# Patient Record
Sex: Male | Born: 1975 | Hispanic: No | Marital: Single | Smoking: Former smoker
Health system: Southern US, Community
[De-identification: ages and names within clinical notes are randomized; demographics above are authoritative.]

## PROBLEM LIST (undated history)

## (undated) DIAGNOSIS — D235 Other benign neoplasm of skin of trunk: Secondary | ICD-10-CM

## (undated) DIAGNOSIS — Z8619 Personal history of other infectious and parasitic diseases: Secondary | ICD-10-CM

## (undated) DIAGNOSIS — K219 Gastro-esophageal reflux disease without esophagitis: Secondary | ICD-10-CM

## (undated) HISTORY — PX: MOLE REMOVAL: SHX2046

## (undated) HISTORY — DX: Gastro-esophageal reflux disease without esophagitis: K21.9

## (undated) HISTORY — DX: Personal history of other infectious and parasitic diseases: Z86.19

## (undated) HISTORY — PX: CYSTECTOMY: SUR359

## (undated) HISTORY — DX: Other benign neoplasm of skin of trunk: D23.5

---

## 2010-07-12 ENCOUNTER — Encounter: Payer: Self-pay | Admitting: Family Medicine

## 2010-07-12 LAB — CONVERTED CEMR LAB
ALT: 24 units/L
AST: 17 units/L
Albumin: 4.8 g/dL
BUN: 11 mg/dL
CO2: 25 meq/L
Calcium: 9.7 mg/dL
Chloride: 102 meq/L
Creatinine, Ser: 0.97 mg/dL
HCT: 45.8 %
HDL: 49 mg/dL
Potassium: 3.9 meq/L
WBC: 4.4 10*3/uL

## 2010-10-10 ENCOUNTER — Encounter: Payer: Self-pay | Admitting: Family Medicine

## 2010-10-10 ENCOUNTER — Ambulatory Visit (INDEPENDENT_AMBULATORY_CARE_PROVIDER_SITE_OTHER): Payer: Managed Care, Other (non HMO) | Admitting: Family Medicine

## 2010-10-10 ENCOUNTER — Ambulatory Visit: Admit: 2010-10-10 | Payer: Self-pay | Admitting: Family Medicine

## 2010-10-10 DIAGNOSIS — D239 Other benign neoplasm of skin, unspecified: Secondary | ICD-10-CM

## 2010-10-10 DIAGNOSIS — K219 Gastro-esophageal reflux disease without esophagitis: Secondary | ICD-10-CM

## 2010-10-10 DIAGNOSIS — R109 Unspecified abdominal pain: Secondary | ICD-10-CM

## 2010-10-10 DIAGNOSIS — E663 Overweight: Secondary | ICD-10-CM

## 2010-10-10 DIAGNOSIS — R1030 Lower abdominal pain, unspecified: Secondary | ICD-10-CM | POA: Insufficient documentation

## 2010-10-10 DIAGNOSIS — E669 Obesity, unspecified: Secondary | ICD-10-CM | POA: Insufficient documentation

## 2010-10-17 NOTE — Assessment & Plan Note (Signed)
Summary: NEW PT TO EST/CLE   Vital Signs:  Patient profile:   35 year old male Height:      71.5 inches Weight:      201.25 pounds BMI:     27.78 Temp:     98.5 degrees F oral Pulse rate:   72 / minute Pulse rhythm:   regular BP sitting:   120 / 80  (left arm) Cuff size:   large  Vitals Entered By: Selena Batten Dance CMA (AAMA) (October 10, 2010 11:32 AM) CC: New patient to establish care   History of Present Illness: CC: new pt, establish  from [facility]  recently got health insurance.  In [place] for last year.  stomach issues - lower abd.  intermittent, cramping, tightness and stabbing.  episodes flare up every few months, last 1-2 wks.  hasn't noticed any foods or stress to trigger flares.  Cramping pain located LLQ described as deep throbbing/stabbing, not relieved by stooling.  Currently no pain.  Endorses visible swelling R abd.  Told had fatty liver in past.    No bloating/indigestion.  No unexpected weight changes (see below).  No f/c/n/v/d/c, blood in stool or urine.  No bloody diarrhea.  Has always been pretty regular as far as BMs.  has 3/day.  + h/o acid reflux in past on prilosec, now controlled with diet.  Has lost 30 lbs over last 6 mo, joined weight watchers.  has increased fiber on weight watchers.  mother with crohn's.  also would like skin spots checked.  has had moles removed x2 in past, told normal.  Preventative: tetanus 5-7 years ago, in [place]. planning wedding in [place] over summer. recently had blood work fasting done for work, will bring in.  Current Medications (verified): 1)  Multivitamins  Tabs (Multiple Vitamin) .... 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  The patient denies anorexia, fever, weight loss, weight gain, vision loss, decreased hearing, hoarseness, chest pain, syncope, dyspnea on exertion, peripheral edema, prolonged cough, headaches, hemoptysis, melena, hematochezia, severe indigestion/heartburn, hematuria, incontinence, depression, and testicular masses.         told has varicocele in past.  Physical Exam  General:  Well-developed,well-nourished,in no acute distress; alert,appropriate and cooperative throughout examination Head:  Normocephalic and atraumatic without obvious abnormalities. No apparent alopecia or balding. Eyes:  No corneal or conjunctival inflammation noted. EOMI. Perrla.  Ears:  Tms clear bilaeterally Nose:  nares clear bilaterally Mouth:  MMM, no pharyngeal erythema/edema Neck:  No deformities, masses, or tenderness noted.  no LAD, no thyromegaly Lungs:  Normal respiratory effort, chest expands symmetrically. Lungs are clear to auscultation, no crackles or wheezes. Heart:  Normal rate and regular rhythm. S1 and S2 normal without gallop, murmur, click, rub or other extra sounds. Abdomen:  Bowel sounds positive,abdomen soft and without masses, organomegaly or hernias noted.  no rebound/guarding.  no pain today. Msk:  No deformity or scoliosis noted of thoracic or lumbar spine.   Pulses:  2+ rad pulses, brisk cap refill Extremities:  no pedal edema Neurologic:  CN grossly intact ,station and gait intact Skin:  Intact without suspicious lesions or rashes  multiple nevi throughout trunk, back.  none suspicious looking.  one R lower ribcage raised, advised to  monitor Psych:  full affect, pleasant and cooperative with exam   Impression & Recommendations:  Problem # 1:  NEVUS (ICD-216.9) benign looking today.  recommend monitor, return if changing.  will likely need yearly skin checks, may refer to derm.  Problem # 2:  ABDOMINAL PAIN OTHER SPECIFIED SITE (ICD-789.09) currently symptom free.  rec return w flare for bloodwork, iFOB.  abd exam benign today.  fmhx crohn's but no blood, BM changes with pain.  not consistent with IBS.  obtain records for previous w/u, imaging.  Problem # 3:  OVERWEIGHT (ICD-278.02) encouraged continued weight loss with weight watchers.  Ht: 71.5 (10/10/2010)   Wt: 201.25 (10/10/2010)   BMI: 27.78 (10/10/2010)  Problem # 4:  ACID REFLUX DISEASE (ICD-530.81) much improved with weight loss.  Complete Medication List: 1)  Multivitamins Tabs (Multiple vitamin) .... Occasional  Patient Instructions: 1)  Return when having a flare.  we will check blood work then. 2)  In meantime, push water, back down on caffeine, ensure you're getting good fiber in diet as well as fruits/vegetables. 3)  Good job with the weight loss!  keep it up. 4)  Sign release form for records from [place].   Orders Added: 1)  New Patient Level III [99203]    Prior Medications: Current Allergies (reviewed today): No known allergies

## 2010-10-19 ENCOUNTER — Encounter: Payer: Self-pay | Admitting: Family Medicine

## 2010-11-05 ENCOUNTER — Encounter: Payer: Self-pay | Admitting: Family Medicine

## 2010-11-05 DIAGNOSIS — Z8619 Personal history of other infectious and parasitic diseases: Secondary | ICD-10-CM | POA: Insufficient documentation

## 2010-11-05 DIAGNOSIS — K219 Gastro-esophageal reflux disease without esophagitis: Secondary | ICD-10-CM | POA: Insufficient documentation

## 2010-11-12 ENCOUNTER — Ambulatory Visit (INDEPENDENT_AMBULATORY_CARE_PROVIDER_SITE_OTHER): Payer: Managed Care, Other (non HMO) | Admitting: Family Medicine

## 2010-11-12 ENCOUNTER — Encounter: Payer: Self-pay | Admitting: Family Medicine

## 2010-11-12 DIAGNOSIS — R300 Dysuria: Secondary | ICD-10-CM

## 2010-11-12 LAB — CONVERTED CEMR LAB
Bilirubin Urine: NEGATIVE
Glucose, Urine, Semiquant: NEGATIVE
Ketones, urine, test strip: NEGATIVE
Nitrite: NEGATIVE
Protein, U semiquant: NEGATIVE
Specific Gravity, Urine: 1.015
Urobilinogen, UA: 0.2
WBC Urine, dipstick: NEGATIVE
pH: 6

## 2010-11-13 ENCOUNTER — Encounter: Payer: Self-pay | Admitting: Family Medicine

## 2010-11-20 NOTE — Assessment & Plan Note (Signed)
Summary: ?UTI/CLE   Vital Signs:  Patient profile:   35 year old male Weight:      204.25 pounds Temp:     98.5 degrees F oral Pulse rate:   80 / minute Pulse rhythm:   regular BP sitting:   118 / 80  (left arm) Cuff size:   large  Vitals Entered By: Selena Batten Dance CMA Duncan Dull) (November 12, 2010 3:58 PM) CC: ? UTI   History of Present Illness: CC: ?UTI  2d h/o incomplete emptying as well as pain.  Some dysuria, some suprapubic pressure.  Did feel nauseated.  increased frequency.  no urgency.  No fevers/chills, vomiting.  No urethral discharge.  No flank pain or lower back pain.  Mild suprapubic pressure.  Has increased cranberry juice and cystex OTC which helped some but still noticing pressure/pain.  lives with GF, monogamous.  Current Medications (verified): 1)  Multivitamins  Tabs (Multiple Vitamin) .... 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 Lungs are clear to auscultation, no crackles or wheezes. Heart:  Normal rate and regular rhythm. S1 and S2 normal without gallop, murmur, click, rub or other extra sounds. Abdomen:  Bowel sounds positive,abdomen soft and without masses, organomegaly or hernias noted.  no rebound/guarding.  no pain today.  no suprapubic pressure Pulses:  2+ rad  pulses, brisk cap refill   Impression & Recommendations:  Problem # 1:  DYSURIA (ICD-788.1) Assessment New UA reassuring, however sxs consistent with UTI.  treat as such, send culture.  if not improved, rec return for further eval, consider prostatitis.  His updated medication list for this problem includes:    Ciprofloxacin Hcl 500 Mg Tabs (Ciprofloxacin hcl) .Marland Kitchen... Take one twice daily for 5 days  Orders: UA Dipstick W/ Micro (manual) (56387) Specimen Handling (99000) T-Culture, Urine ([id number])  Complete Medication List: 1)  Multivitamins Tabs (Multiple vitamin) .... Occasional 2)  Ciprofloxacin Hcl 500 Mg Tabs (Ciprofloxacin hcl) .... Take one twice daily for 5 days  Patient Instructions: 1)  Urine is looking overall clear, however symptoms do sound like infection. 2)  Will send culture. 3)  Will treat as such with 5 days of antibiotic twice daily. 4)  Push fluids, plenty of rest.  tylenol for discomfort. 5)  Update [place] if not improved after antibiotic. Prescriptions: CIPROFLOXACIN HCL 500 MG TABS (CIPROFLOXACIN HCL) take one twice daily for 5 days  #10 x 0   Entered and Authorized by:   Eustaquio Boyden  MD   Signed by:   Eustaquio Boyden  MD on 11/12/2010   Method used:   Electronically to        [facility] (retail)       [address]       [place], [place]  [address]       Ph: [phone] or [phone]       Fax: [phone]  RxID:   [id number]    Orders Added: 1)  UA Dipstick W/ Micro (manual) [81000] 2)  Est. Patient Level III [06269] 3)  Specimen Handling [99000] 4)  T-Culture, Urine [[id number]]    Current Allergies (reviewed today): No known allergies   Laboratory Results   Urine Tests  Date/Time Received: November 12, 2010 3:59 PM  Date/Time Reported: November 12, 2010 3:59 PM   Routine Urinalysis   Color: lt. yellow Appearance: Hazy Glucose: negative   (Normal Range: Negative) Bilirubin: negative   (Normal Range:  Negative) Ketone: negative   (Normal Range: Negative) Spec. Gravity: 1.015   (Normal Range: 1.003-1.035) Blood: trace-lysed   (Normal Range: Negative) pH: 6.0   (Normal Range: 5.0-8.0) Protein: negative   (Normal Range: Negative) Urobilinogen: 0.2   (Normal Range: 0-1) Nitrite: negative   (Normal Range: Negative) Leukocyte Esterace: negative   (Normal Range: Negative)  Urine Microscopic WBC/HPF: none RBC/HPF: rare Bacteria/HPF: tr Mucous/HPF: no Epithelial/HPF: no Crystals/HPF: no Casts/LPF: no Yeast/HPF: no    Comments: read by ..............Eustaquio Boyden  MD  November 12, 2010 4:26 PM  UCx sent.

## 2010-11-26 ENCOUNTER — Other Ambulatory Visit: Payer: Self-pay | Admitting: Family Medicine

## 2010-11-26 ENCOUNTER — Encounter (INDEPENDENT_AMBULATORY_CARE_PROVIDER_SITE_OTHER): Payer: Managed Care, Other (non HMO) | Admitting: Family Medicine

## 2010-11-26 ENCOUNTER — Encounter: Payer: Self-pay | Admitting: Family Medicine

## 2010-11-26 DIAGNOSIS — E785 Hyperlipidemia, unspecified: Secondary | ICD-10-CM

## 2010-11-26 DIAGNOSIS — E663 Overweight: Secondary | ICD-10-CM

## 2010-11-26 DIAGNOSIS — Z0000 Encounter for general adult medical examination without abnormal findings: Secondary | ICD-10-CM

## 2010-11-26 LAB — HEPATIC FUNCTION PANEL
ALT: 46 U/L (ref 0–53)
AST: 47 U/L — ABNORMAL HIGH (ref 0–37)
Albumin: 4.3 g/dL (ref 3.5–5.2)
Alkaline Phosphatase: 47 U/L (ref 39–117)
Bilirubin, Direct: 0.1 mg/dL (ref 0.0–0.3)
Total Protein: 6.9 g/dL (ref 6.0–8.3)

## 2010-12-06 NOTE — Assessment & Plan Note (Signed)
Summary: CPX-pt advised to fast//kad   Vital Signs:  Patient profile:   35 year old male Weight:      201.75 pounds (91.70 kg) BMI:     27.85 Temp:     98.4 degrees F (36.89 degrees C) oral Pulse rate:   72 / minute Pulse rhythm:   regular BP sitting:   114 / 80  (left arm) Cuff size:   large  Vitals Entered By: Selena Batten Dance CMA (AAMA) (November 26, 2010 8:30 AM) CC: CPX   History of Present Illness: CC: CPE today  no concerns today.  last visit cholesterol was high, sent cholesterol diet.  has been doing weight watchers diet.  more fiber, less fatty foods.  Going to Gym 3x/wk, joined Y 3 wks ago.  weight stable at 201.  this am had skim milk with cheerios.  wedding scheduled June 5th.  Current Medications (verified): 1)  Multivitamins  Tabs (Multiple Vitamin) .... 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  Physical Exam  General:  Well-developed,well-nourished,in no acute distress; alert,appropriate and cooperative throughout examination Head:  Normocephalic and atraumatic without obvious abnormalities. No apparent alopecia or balding. Eyes:  No corneal or conjunctival inflammation noted. EOMI. Perrla.  Ears:  Tms clear  bilaeterally Nose:  nares clear bilaterally Mouth:  MMM, no pharyngeal erythema/edema Neck:  No deformities, masses, or tenderness noted.  no LAD, no thyromegaly Lungs:  Normal respiratory effort, chest expands symmetrically. Lungs are clear to auscultation, no crackles or wheezes. Heart:  Normal rate and regular rhythm. S1 and S2 normal without gallop, murmur, click, rub or other extra sounds. Abdomen:  Bowel sounds positive,abdomen soft and non-tender without masses, organomegaly or hernias noted. Msk:  No deformity or scoliosis noted of thoracic or lumbar spine.   Pulses:  2+ rad pulses, brisk cap refill Extremities:  no pedal edema Neurologic:  CN grossly intact ,station and gait intact Skin:  Intact without suspicious lesions or rashes  multiple nevi throughout trunk, back.  none suspicious looking.  one R lower ribcage raised, advised to monitor Psych:  full affect, pleasant and cooperative with exam   Impression & Recommendations:  Problem # 1:  HEALTH MAINTENANCE EXAM (ICD-V70.0)  Reviewed preventive care protocols, scheduled due services, and updated immunizations. age apporpriate preventative measures discussed. 100% seat belt use sunscreen use.  Problem # 2:  HYPERLIPIDEMIA, MILD (ICD-272.4) recheck dLDL.  discussed healthy diet, exercise. Orders: TLB-Cholesterol, Direct LDL (83721-DIRLDL) Venipuncture (04540)  Labs Reviewed: SGOT: 17 (07/12/2010)   SGPT: 24 (07/12/2010)   HDL:49 (07/12/2010)  LDL:162 (07/12/2010)  Chol:232 (07/12/2010)  Trig:106 (07/12/2010)  Problem # 3:  OVERWEIGHT (ICD-278.02) overweight.  discussed healthy diet, exercise.  Orders: TLB-Hepatic/Liver Function Pnl (80076-HEPATIC) TLB-Glucose, QUANT (82947-GLU)  Ht: 71.5 (10/10/2010)   Wt: 201.75 (11/26/2010)  BMI: 27.85 (11/26/2010)  Complete Medication List: 1)  Multivitamins Tabs (Multiple vitamin) .... Daily  Patient Instructions: 1)  Great to see you today. 2)  Call clinic with  questions. 3)  have a wonderful time at [facility] 4)  return in 1-2 years for next complete physical, or as needed.   Orders Added: 1)  TLB-Cholesterol, Direct LDL [83721-DIRLDL] 2)  TLB-Hepatic/Liver Function Pnl [80076-HEPATIC] 3)  TLB-Glucose, QUANT [82947-GLU] 4)  Venipuncture [36415] 5)  Est. Patient 18-39 years [99395]    Current Allergies (reviewed today): No known allergies

## 2011-02-19 ENCOUNTER — Encounter: Payer: Self-pay | Admitting: Family Medicine

## 2011-02-19 ENCOUNTER — Ambulatory Visit (INDEPENDENT_AMBULATORY_CARE_PROVIDER_SITE_OTHER): Payer: Managed Care, Other (non HMO) | Admitting: Family Medicine

## 2011-02-19 VITALS — BP 116/80 | HR 76 | Temp 98.4°F | Wt 211.0 lb

## 2011-02-19 DIAGNOSIS — Z0110 Encounter for examination of ears and hearing without abnormal findings: Secondary | ICD-10-CM

## 2011-02-19 DIAGNOSIS — H9209 Otalgia, unspecified ear: Secondary | ICD-10-CM

## 2011-02-19 DIAGNOSIS — H9201 Otalgia, right ear: Secondary | ICD-10-CM

## 2011-02-19 MED ORDER — CIPROFLOXACIN-HYDROCORTISONE 0.2-1 % OT SUSP: 3.0000 [drp] | Freq: Two times a day (BID) | OTIC | Status: DC

## 2011-02-19 MED ORDER — CIPROFLOXACIN-HYDROCORTISONE 0.2-1 % OT SUSP: 3.0000 [drp] | Freq: Two times a day (BID) | OTIC | Status: AC

## 2011-02-19 NOTE — Patient Instructions (Addendum)
You likely had some barotrauma to ear with hearing difference due to this. May use tylenol/ibuprofen as needed for discomfort. This should heal well with time.  May use afrin nasal spray as decongestant, but don't use more than 3 days at a time. Some swelling of outer ear and possible swimmer's ear - treat with cipro otic drops. If not improving over next few weeks, let us know. Call us with questions.

## 2011-02-19 NOTE — Assessment & Plan Note (Signed)
Anticipate barotrauma from recent plane rides and scuba diving. Some evidence of external otitis on exam as well (tender external ear, edema of canal.)  Treat with cipro otic.   No perforation of TM. Discussed supportive care as per instructions and anticipated course of illness.

## 2011-02-19 NOTE — Progress Notes (Signed)
  Subjective:    Patient ID: Jordan Shelton, male    DOB: Jun 23, 1976, 35 y.o.   MRN: [id number]  HPI CC: R ear pain  Wedding June 5th, [address] [place] wedding, water exposure.  Scuba diving 02/14/2011, worried went down too fast.  Felt plugged ear immediately after on R side.  Yesterday noticed starting to become more painful.  Pain with chewing, pressure sensation.  + tinnitus on right.  Some muffled hearing.  No discharge/draining from ear, no fevers/chills, congestion, RN, cough.  No h/o ear issues in past.  Review of Systems Per HPI    Objective:   Physical Exam  Nursing note and vitals reviewed. Constitutional: He appears well-developed and well-nourished. No distress.  HENT:  Head: Normocephalic and atraumatic.  Right Ear: External ear normal. No mastoid tenderness. Tympanic membrane is not perforated.  Left Ear: Hearing, tympanic membrane, external ear and ear canal normal.  Nose: Nose normal.  Mouth/Throat: Oropharynx is clear and moist. No oropharyngeal exudate.       R ear: Decreased hearing on right. External ear with edema, no discharge. TM without perf, some congestion behind TM. Rhinne normal. Weber localizes to left (good) ear.  Eyes: Conjunctivae and EOM are normal. Pupils are equal, round, and reactive to light. No scleral icterus.  Neck: Normal range of motion. Neck supple.  Lymphadenopathy:    He has no cervical adenopathy.  Skin: Skin is warm and dry. No rash noted.  Psychiatric: He has a normal mood and affect.          Assessment & Plan:

## 2011-02-20 ENCOUNTER — Telehealth: Payer: Self-pay | Admitting: *Deleted

## 2011-02-20 MED ORDER — ANTIPYRINE-BENZOCAINE 55-14 MG/ML OT SOLN: 2.0000 [drp] | Freq: Four times a day (QID) | OTIC | Status: DC | PRN

## 2011-02-20 NOTE — Telephone Encounter (Signed)
Patient was seen yesterday with an ear ache and he says that today the pain is much worse and nothing seems to be helping it. He is asking if there is possibly an ear drop that can be called to help with the pain. [facility].

## 2011-02-20 NOTE — Telephone Encounter (Signed)
I called pt.  The ear is still tender but he doesn't have mastoid pain.  No skin changes on the neck, so spreading erythema per report.  Pain continues.  I talked to him about this.  I would use aurulgan otic.  I called it in.  If not improved, he may need ENT eval.  He'll call back with update tomorrow in AM. He agrees. Routed to Dr. Reece Agar as a FYI.

## 2011-02-21 NOTE — Telephone Encounter (Signed)
Noted. Thanks.  Will await f/u call today.

## 2011-06-10 HISTORY — PX: OTHER SURGICAL HISTORY: SHX169

## 2011-06-12 ENCOUNTER — Other Ambulatory Visit (INDEPENDENT_AMBULATORY_CARE_PROVIDER_SITE_OTHER): Payer: Managed Care, Other (non HMO)

## 2011-06-12 ENCOUNTER — Other Ambulatory Visit: Payer: Self-pay | Admitting: Family Medicine

## 2011-06-12 DIAGNOSIS — Z0000 Encounter for general adult medical examination without abnormal findings: Secondary | ICD-10-CM

## 2011-06-12 DIAGNOSIS — E785 Hyperlipidemia, unspecified: Secondary | ICD-10-CM

## 2011-06-12 DIAGNOSIS — E663 Overweight: Secondary | ICD-10-CM

## 2011-06-12 LAB — COMPREHENSIVE METABOLIC PANEL
Albumin: 4.4 g/dL (ref 3.5–5.2)
CO2: 27 mEq/L (ref 19–32)
GFR: 85.28 mL/min (ref >60.00)
Glucose, Bld: 102 mg/dL — ABNORMAL HIGH (ref 70–99)
Potassium: 4 mEq/L (ref 3.5–5.1)
Sodium: 139 mEq/L (ref 135–145)
Total Protein: 7.6 g/dL (ref 6.0–8.3)

## 2011-06-12 LAB — LIPID PANEL
Cholesterol: 241 mg/dL — ABNORMAL HIGH (ref 0–200)
Total CHOL/HDL Ratio: 5

## 2011-06-14 ENCOUNTER — Encounter: Payer: Self-pay | Admitting: Family Medicine

## 2011-06-14 ENCOUNTER — Ambulatory Visit (INDEPENDENT_AMBULATORY_CARE_PROVIDER_SITE_OTHER): Payer: Managed Care, Other (non HMO) | Admitting: Family Medicine

## 2011-06-14 VITALS — BP 118/80 | HR 72 | Temp 98.8°F | Wt 210.8 lb

## 2011-06-14 DIAGNOSIS — K7689 Other specified diseases of liver: Secondary | ICD-10-CM

## 2011-06-14 DIAGNOSIS — K760 Fatty (change of) liver, not elsewhere classified: Secondary | ICD-10-CM

## 2011-06-14 DIAGNOSIS — R7309 Other abnormal glucose: Secondary | ICD-10-CM

## 2011-06-14 DIAGNOSIS — R109 Unspecified abdominal pain: Secondary | ICD-10-CM

## 2011-06-14 DIAGNOSIS — R7303 Prediabetes: Secondary | ICD-10-CM

## 2011-06-14 DIAGNOSIS — E785 Hyperlipidemia, unspecified: Secondary | ICD-10-CM

## 2011-06-14 MED ORDER — HYOSCYAMINE SULFATE 0.125 MG PO TABS: 0.1250 mg | ORAL_TABLET | ORAL | Status: AC | PRN

## 2011-06-14 NOTE — Patient Instructions (Signed)
I'm not sure where this pain is coming from. Pass by marion's office to schedule ultrasound to follow fatty liver. We will do trial of levsin for abdominal cramping.  Let me know how that goes. Good to see you today, call us with questions.

## 2011-06-14 NOTE — Progress Notes (Signed)
  Subjective:    Patient ID: Jordan Shelton, male    DOB: 05-Jul-1976, 35 y.o.   MRN: [id number]  HPI CC: abd issues  Here for CPE but actually had CPE done 11/2010.  Needs form filled out for work  Having stomach issues lately.  Going on 2 wks, nagging, intermittent cramping, tightness and stabbing.  2d ago stopped bothering him.  Episodes flare every few months, last 1-2 wks.  Initially thought due to fiber intake but no change this last time.  Sometimes uncomfortable to sleep on R side - feels tennis ball bulge on right side.  Feels deep pain.  Pain lower abdomen, no radiation up chest, to back or groin.  No fevers/chills, blood in stool, no BM, nausea/vomiting, diarrhea, constipation.  No urinary changes, dysuria, urgency, frequency, hematuria.  hasn't noticed any foods or stress to trigger flares. not relieved by stooling. Currently no pain.   No bloating/indigestion. No unexpected weight changes (see below).  Has always had regular BMs, about 3/day is norm for him, soft stools.  + h/o acid reflux in past on prilosec, that was different - burning sensation, localized to middle of chest.  now reflux controlled with diet.    No recent stress.  mother with crohn's.  Had US done 3-4 years ago for same pain - told fatty liver.  Reviewed diet and exercise. Wt Readings from Last 3 Encounters:  06/14/11 210 lb 12 oz (95.596 kg)  02/19/11 211 lb 0.6 oz (95.727 kg)  11/26/10 201 lb 12 oz (91.513 kg)   Review of Systems Per HPI    Objective:   Physical Exam  Nursing note and vitals reviewed. Constitutional: He appears well-developed and well-nourished. No distress.  HENT:  Head: Normocephalic and atraumatic.  Mouth/Throat: Oropharynx is clear and moist. No oropharyngeal exudate.  Eyes: Conjunctivae and EOM are normal. Pupils are equal, round, and reactive to light. No scleral icterus.  Neck: Normal range of motion. Neck supple.  Cardiovascular: Normal rate, regular rhythm, normal heart  sounds and intact distal pulses.   No murmur heard. Pulmonary/Chest: Effort normal and breath sounds normal. No respiratory distress. He has no wheezes. He has no rales.  Abdominal: Soft. Bowel sounds are normal. He exhibits no distension. There is no tenderness. There is no rebound and no guarding.  Musculoskeletal: He exhibits no edema.  Skin: Skin is warm and dry. No rash noted.  Psychiatric: He has a normal mood and affect.          Assessment & Plan:

## 2011-06-15 DIAGNOSIS — K760 Fatty (change of) liver, not elsewhere classified: Secondary | ICD-10-CM | POA: Insufficient documentation

## 2011-06-15 DIAGNOSIS — R7303 Prediabetes: Secondary | ICD-10-CM | POA: Insufficient documentation

## 2011-06-15 NOTE — Assessment & Plan Note (Signed)
Discussed this diagnosis with A1c 5.9% and fasting cbg 109. Discussed improtance of weight loss and watching diet.

## 2011-06-15 NOTE — Assessment & Plan Note (Signed)
Reviewed blood work - TC slightly high, LDL 170s.  Has low cholesterol diet at home.

## 2011-06-15 NOTE — Assessment & Plan Note (Signed)
has been told had this in past by [place].   Recheck [place] to follow.

## 2011-06-15 NOTE — Assessment & Plan Note (Addendum)
Not consistent with any specific abd pathology. Reviewed recent blood work - normal. ?IBS although not relieved with stooling, no BM changes with this. Not consistent with pancreatitis, crohn's.  Denies association with food or stress. Start with trial of levsin for when has flare. May recommend trial off lactose, or while in flare. If worsening, may refer to GI for further evaluation.

## 2011-06-17 ENCOUNTER — Encounter: Payer: Self-pay | Admitting: Family Medicine

## 2011-06-17 ENCOUNTER — Ambulatory Visit: Payer: Self-pay | Admitting: Family Medicine

## 2013-10-21 ENCOUNTER — Ambulatory Visit: Payer: 59 | Admitting: Family Medicine

## 2013-10-21 ENCOUNTER — Encounter: Payer: 59 | Admitting: Family Medicine

## 2013-10-21 ENCOUNTER — Ambulatory Visit (INDEPENDENT_AMBULATORY_CARE_PROVIDER_SITE_OTHER): Payer: BC Managed Care – HMO | Admitting: Family Medicine

## 2013-10-21 ENCOUNTER — Encounter: Payer: Self-pay | Admitting: Family Medicine

## 2013-10-21 VITALS — BP 124/86 | HR 88 | Temp 98.3°F | Wt 235.0 lb

## 2013-10-21 DIAGNOSIS — R590 Localized enlarged lymph nodes: Secondary | ICD-10-CM | POA: Insufficient documentation

## 2013-10-21 DIAGNOSIS — Z202 Contact with and (suspected) exposure to infections with a predominantly sexual mode of transmission: Secondary | ICD-10-CM

## 2013-10-21 DIAGNOSIS — R599 Enlarged lymph nodes, unspecified: Secondary | ICD-10-CM

## 2013-10-21 NOTE — Progress Notes (Signed)
Pre-visit discussion using our clinic review tool. No additional management support is needed unless otherwise documented below in the visit note.  

## 2013-10-21 NOTE — Progress Notes (Signed)
BP 124/86  Pulse 88  Temp(Src) 98.3 F (36.8 C) (Oral)  Wt 235 lb (106.595 kg)   CC: R jaw pain  Subjective:    Patient ID: Jordan Shelton, male    DOB: Feb 26, 1976, 38 y.o.   MRN: [id number]  HPI: Jordan Shelton is a 38 y.o. male presenting on 10/21/2013 with Jaw and neck pain  Not seen since 06/2011.  Endorses 8-9d h/o R jaw pain right under jaw bone.  Tends to recur over last few years.  Improves with tylenol or ibuprofen.  Usually with accompanying tooth pain, but not this time.  This time lingering pain, has not improved with NSAIDs or tylenol.  Describes 2/10 pain, tender to touch.  Some radiation of pain to ear. No trouble opening/closing mouth.  No fevers/chills, ear or tooth pain, sore throat.  No recent viral URI.  Denies change in salivation on right. Last saw dentist 6 mo ago, no concerns at that time.  Also - wife expecting, recenlty diagnosed with gonorrhea, but ?false positive.  Treated regardless.  Pt would like to be tested today - will return for urine CT/GC.  Endorses monogamous relationship, denies STD sxs.  Wt Readings from Last 3 Encounters:  10/21/13 235 lb (106.595 kg)  06/14/11 210 lb 12 oz (95.596 kg)  02/19/11 211 lb 0.6 oz (95.727 kg)  Body mass index is 32.32 kg/(m^2).  Relevant past medical, surgical, family and social history reviewed and updated. Allergies and medications reviewed and updated. Current Outpatient Prescriptions on File Prior to Visit  Medication Sig  . Multiple Vitamin (MULTIVITAMIN) tablet Take 1 tablet by mouth daily.     No current facility-administered medications on file prior to visit.    Review of Systems Per HPI unless specifically indicated above    Objective:    BP 124/86  Pulse 88  Temp(Src) 98.3 F (36.8 C) (Oral)  Wt 235 lb (106.595 kg)  Physical Exam  Nursing note and vitals reviewed. Constitutional: He appears well-developed and well-nourished. No distress.  HENT:  Head: Normocephalic and atraumatic.  Right Ear:  Hearing, tympanic membrane, external ear and ear canal normal.  Left Ear: Hearing, tympanic membrane, external ear and ear canal normal.  Nose: Nose normal. No mucosal edema or rhinorrhea. Right sinus exhibits no maxillary sinus tenderness and no frontal sinus tenderness. Left sinus exhibits no maxillary sinus tenderness and no frontal sinus tenderness.  Mouth/Throat: Uvula is midline, oropharynx is clear and moist and mucous membranes are normal. Abnormal dentition. No oropharyngeal exudate, posterior oropharyngeal edema, posterior oropharyngeal erythema or tonsillar abscesses.  No pain orally to palpation, no dental/gingival disease noted of entire R mouth, no oral lesions. Mildly tender to palpation at R parotid but no mass appreciated. L lower 2nd molar with dental disease  Eyes: Conjunctivae and EOM are normal. Pupils are equal, round, and reactive to light. No scleral icterus.  Neck: Normal range of motion. Neck supple. No thyromegaly present.  Lymphadenopathy:    He has cervical adenopathy (mild R subparotid LAD).  Skin: Skin is warm and dry. No rash noted.       Assessment & Plan:   Problem List Items Addressed This Visit   LAD (lymphadenopathy) of right cervical region - Primary     ?postinfectious (although pt denies h/o this).  Recommended treat with aleve bid x 5 days and OTC antihistamine.  If not improving, consider referral to ENT for eval parotid stone vs salivary issue as this is a recurring issue over last year.  Pt agrees with plan.     Other Visit Diagnoses   Exposure to STD        Relevant Orders       GC/chlamydia probe amp, urine      pt will return for STD screen with CT/GC urine probe.  Follow up plan: Return if symptoms worsen or fail to improve.

## 2013-10-21 NOTE — Addendum Note (Signed)
Addended by: Ellamae Sia on: 10/21/2013 04:33 PM   Modules accepted: Orders

## 2013-10-21 NOTE — Patient Instructions (Signed)
You do have a slightly swollen gland under right jaw - treat with aleve twice daily for 5 days with food and zyrtec or claritin daily for a week. If not improved, or if recurrent let me know and we could refer you to ENT to check saliva gland on right side. Return later this afternoon for urine test.

## 2013-10-21 NOTE — Assessment & Plan Note (Signed)
?  postinfectious (although pt denies h/o this).  Recommended treat with aleve bid x 5 days and OTC antihistamine.  If not improving, consider referral to ENT for eval parotid stone vs salivary issue as this is a recurring issue over last year.  Pt agrees with plan.

## 2013-10-22 LAB — GC/CHLAMYDIA PROBE AMP, URINE
CHLAMYDIA, SWAB/URINE, PCR: NEGATIVE
GC Probe Amp, Urine: NEGATIVE

## 2013-11-05 ENCOUNTER — Encounter: Payer: 59 | Admitting: Family Medicine

## 2013-11-11 ENCOUNTER — Other Ambulatory Visit: Payer: Self-pay | Admitting: Family Medicine

## 2013-11-11 DIAGNOSIS — E785 Hyperlipidemia, unspecified: Secondary | ICD-10-CM

## 2013-11-11 DIAGNOSIS — K760 Fatty (change of) liver, not elsewhere classified: Secondary | ICD-10-CM

## 2013-11-11 DIAGNOSIS — R7303 Prediabetes: Secondary | ICD-10-CM

## 2013-11-11 DIAGNOSIS — R590 Localized enlarged lymph nodes: Secondary | ICD-10-CM

## 2013-11-12 ENCOUNTER — Other Ambulatory Visit (INDEPENDENT_AMBULATORY_CARE_PROVIDER_SITE_OTHER): Payer: BC Managed Care – HMO

## 2013-11-12 DIAGNOSIS — R599 Enlarged lymph nodes, unspecified: Secondary | ICD-10-CM

## 2013-11-12 DIAGNOSIS — K760 Fatty (change of) liver, not elsewhere classified: Secondary | ICD-10-CM

## 2013-11-12 DIAGNOSIS — K7689 Other specified diseases of liver: Secondary | ICD-10-CM

## 2013-11-12 DIAGNOSIS — R590 Localized enlarged lymph nodes: Secondary | ICD-10-CM

## 2013-11-12 DIAGNOSIS — E785 Hyperlipidemia, unspecified: Secondary | ICD-10-CM

## 2013-11-12 DIAGNOSIS — R7303 Prediabetes: Secondary | ICD-10-CM

## 2013-11-12 DIAGNOSIS — R7309 Other abnormal glucose: Secondary | ICD-10-CM

## 2013-11-12 LAB — COMPREHENSIVE METABOLIC PANEL
ALK PHOS: 48 U/L (ref 39–117)
ALT: 35 U/L (ref 0–53)
AST: 26 U/L (ref 0–37)
Albumin: 4.1 g/dL (ref 3.5–5.2)
BILIRUBIN TOTAL: 0.5 mg/dL (ref 0.3–1.2)
BUN: 17 mg/dL (ref 6–23)
CALCIUM: 9.3 mg/dL (ref 8.4–10.5)
CHLORIDE: 105 meq/L (ref 96–112)
CO2: 27 mEq/L (ref 19–32)
CREATININE: 1.2 mg/dL (ref 0.4–1.5)
GFR: 74.29 mL/min (ref >60.00)
Glucose, Bld: 100 mg/dL — ABNORMAL HIGH (ref 70–99)
Potassium: 4.6 mEq/L (ref 3.5–5.1)
Sodium: 139 mEq/L (ref 135–145)
Total Protein: 7.2 g/dL (ref 6.0–8.3)

## 2013-11-12 LAB — LIPID PANEL
CHOL/HDL RATIO: 6
Cholesterol: 248 mg/dL — ABNORMAL HIGH (ref 0–200)
HDL: 41 mg/dL (ref >39.00)
LDL CALC: 165 mg/dL — AB (ref 0–99)
Triglycerides: 208 mg/dL — ABNORMAL HIGH (ref 0.0–149.0)
VLDL: 41.6 mg/dL — ABNORMAL HIGH (ref 0.0–40.0)

## 2013-11-12 LAB — CBC WITH DIFFERENTIAL/PLATELET
BASOS ABS: 0 10*3/uL (ref 0.0–0.1)
BASOS PCT: 0.4 % (ref 0.0–3.0)
EOS ABS: 0.4 10*3/uL (ref 0.0–0.7)
Eosinophils Relative: 6.1 % — ABNORMAL HIGH (ref 0.0–5.0)
HEMATOCRIT: 43.5 % (ref 39.0–52.0)
HEMOGLOBIN: 14.5 g/dL (ref 13.0–17.0)
Lymphocytes Relative: 32.5 % (ref 12.0–46.0)
Lymphs Abs: 1.9 10*3/uL (ref 0.7–4.0)
MCHC: 33.3 g/dL (ref 30.0–36.0)
MCV: 84.8 fl (ref 78.0–100.0)
Monocytes Absolute: 0.6 10*3/uL (ref 0.1–1.0)
Monocytes Relative: 9.9 % (ref 3.0–12.0)
NEUTROS ABS: 3 10*3/uL (ref 1.4–7.7)
Neutrophils Relative %: 51.1 % (ref 43.0–77.0)
Platelets: 211 10*3/uL (ref 150.0–400.0)
RBC: 5.13 Mil/uL (ref 4.22–5.81)
RDW: 13.9 % (ref 11.5–14.6)
WBC: 5.9 10*3/uL (ref 4.5–10.5)

## 2013-11-12 LAB — HEMOGLOBIN A1C: HEMOGLOBIN A1C: 6.1 % (ref 4.6–6.5)

## 2013-11-19 ENCOUNTER — Ambulatory Visit (INDEPENDENT_AMBULATORY_CARE_PROVIDER_SITE_OTHER): Payer: BC Managed Care – HMO | Admitting: Family Medicine

## 2013-11-19 ENCOUNTER — Encounter: Payer: Self-pay | Admitting: Family Medicine

## 2013-11-19 VITALS — BP 130/90 | HR 80 | Temp 98.7°F | Ht 71.5 in | Wt 240.0 lb

## 2013-11-19 DIAGNOSIS — Z0000 Encounter for general adult medical examination without abnormal findings: Secondary | ICD-10-CM | POA: Insufficient documentation

## 2013-11-19 DIAGNOSIS — R7309 Other abnormal glucose: Secondary | ICD-10-CM

## 2013-11-19 DIAGNOSIS — Z23 Encounter for immunization: Secondary | ICD-10-CM

## 2013-11-19 DIAGNOSIS — R7303 Prediabetes: Secondary | ICD-10-CM

## 2013-11-19 DIAGNOSIS — E785 Hyperlipidemia, unspecified: Secondary | ICD-10-CM

## 2013-11-19 DIAGNOSIS — E663 Overweight: Secondary | ICD-10-CM

## 2013-11-19 DIAGNOSIS — K219 Gastro-esophageal reflux disease without esophagitis: Secondary | ICD-10-CM

## 2013-11-19 MED ORDER — OMEPRAZOLE 40 MG PO CPDR: 40.0000 mg | DELAYED_RELEASE_CAPSULE | Freq: Every day | ORAL | Status: DC

## 2013-11-19 NOTE — Assessment & Plan Note (Signed)
Prescribe omeprazole 40mg  daily for worsening GERD - pt aware of weight relation to worsening GERD sxs.

## 2013-11-19 NOTE — Addendum Note (Signed)
Addended by: Royann Shivers A on: 11/19/2013 12:38 PM   Modules accepted: Orders

## 2013-11-19 NOTE — Assessment & Plan Note (Signed)
Body mass index is 33.01 kg/(m^2). Reviewed diet/lifestyle changes to decrease weight.

## 2013-11-19 NOTE — Assessment & Plan Note (Signed)
Reviewed sugar and A1c.  Discussed weight loss importance.

## 2013-11-19 NOTE — Progress Notes (Signed)
BP 130/90  Pulse 80  Temp(Src) 98.7 F (37.1 C) (Oral)  Ht 5' 11.5" (1.816 m)  Wt 240 lb (108.863 kg)  BMI 33.01 kg/m2   CC: CPE  Subjective:    Patient ID: Jordan Shelton, male    DOB: April 14, 1976, 38 y.o.   MRN: [id number]  HPI: Jordan Shelton is a 38 y.o. male presenting on 11/19/2013 for Annual Exam   See prior note for details re cervical LAD - this has resolved. BP Readings from Last 3 Encounters:  11/19/13 130/90  10/21/13 124/86  06/14/11 118/80   Wt Readings from Last 3 Encounters:  11/19/13 240 lb (108.863 kg)  10/21/13 235 lb (106.595 kg)  06/14/11 210 lb 12 oz (95.596 kg)  goal weight 200 - by Summer.  GERD - worsening sxs recently, esp worse at night time.  Has tried zegerid OTC , requests Rx for this.  Lives with wife and daughter.  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  Preventative: Flu - did not receive Tdap today  Relevant past medical, surgical, family and social history reviewed and updated as indicated.  Allergies and medications reviewed and updated. Current Outpatient Prescriptions on File Prior to Visit  Medication Sig  . Multiple Vitamin (MULTIVITAMIN) tablet Take 1 tablet by mouth daily.     No current facility-administered medications on file prior to visit.    Review of Systems  Constitutional: Positive for unexpected weight change (gain). Negative for fever, chills, activity change, appetite change and fatigue.  HENT: Negative for hearing loss.   Eyes: Negative for visual disturbance.  Respiratory: Negative for cough, chest tightness, shortness of breath and wheezing.   Cardiovascular: Negative for chest pain, palpitations and leg swelling.  Gastrointestinal: Negative for nausea, vomiting, abdominal pain, diarrhea, constipation, blood in stool and abdominal distention.  Genitourinary: Negative for  hematuria and difficulty urinating.  Musculoskeletal: Negative for arthralgias, myalgias and neck pain.  Skin: Negative for rash.  Neurological: Negative for dizziness, seizures, syncope and headaches.  Hematological: Negative for adenopathy. Does not bruise/bleed easily.  Psychiatric/Behavioral: Negative for dysphoric mood. The patient is not nervous/anxious.    Per HPI unless specifically indicated above    Objective:    BP 130/90  Pulse 80  Temp(Src) 98.7 F (37.1 C) (Oral)  Ht 5' 11.5" (1.816 m)  Wt 240 lb (108.863 kg)  BMI 33.01 kg/m2  Physical Exam  Nursing note and vitals reviewed. Constitutional: He is oriented to person, place, and time. He appears well-developed and well-nourished. No distress.  HENT:  Head: Normocephalic and atraumatic.  Right Ear: Hearing, tympanic membrane, external ear and ear canal normal.  Left Ear: Hearing, tympanic membrane, external ear and ear canal normal.  Nose: Nose normal.  Mouth/Throat: Uvula is midline, oropharynx is clear and moist and mucous membranes are normal. No oropharyngeal exudate, posterior oropharyngeal edema or posterior oropharyngeal erythema.  Eyes: Conjunctivae and EOM are normal. Pupils are equal, round, and reactive to light. No scleral icterus.  Neck: Normal range of motion. Neck supple. No thyromegaly present.  Cardiovascular: Normal rate, regular rhythm, normal heart sounds and intact distal pulses.   No murmur heard. Pulses:      Radial pulses are 2+ on the right side, and 2+ on the left side.  Pulmonary/Chest: Effort normal and breath sounds normal. No respiratory distress. He has no wheezes. He has no rales.  Abdominal: Soft. Bowel  sounds are normal. He exhibits no distension and no mass. There is no tenderness. There is no rebound and no guarding.  Musculoskeletal: Normal range of motion. He exhibits no edema.  Lymphadenopathy:    He has no cervical adenopathy.  Neurological: He is alert and oriented to person,  place, and time.  CN grossly intact, station and gait intact  Skin: Skin is warm and dry. No rash noted.  Psychiatric: He has a normal mood and affect. His behavior is normal. Judgment and thought content normal.   Results for orders placed in visit on 11/12/13  LIPID PANEL      Result Value Ref Range   Cholesterol 248 (*) 0 - 200 mg/dL   Triglycerides 208.0 (*) 0.0 - 149.0 mg/dL   HDL 41.00  >39.00 mg/dL   VLDL 41.6 (*) 0.0 - 40.0 mg/dL   LDL Cholesterol 165 (*) 0 - 99 mg/dL   Total CHOL/HDL Ratio 6    HEMOGLOBIN A1C      Result Value Ref Range   Hemoglobin A1C 6.1  4.6 - 6.5 %  COMPREHENSIVE METABOLIC PANEL      Result Value Ref Range   Sodium 139  135 - 145 mEq/L   Potassium 4.6  3.5 - 5.1 mEq/L   Chloride 105  96 - 112 mEq/L   CO2 27  19 - 32 mEq/L   Glucose, Bld 100 (*) 70 - 99 mg/dL   BUN 17  6 - 23 mg/dL   Creatinine, Ser 1.2  0.4 - 1.5 mg/dL   Total Bilirubin 0.5  0.3 - 1.2 mg/dL   Alkaline Phosphatase 48  39 - 117 U/L   AST 26  0 - 37 U/L   ALT 35  0 - 53 U/L   Total Protein 7.2  6.0 - 8.3 g/dL   Albumin 4.1  3.5 - 5.2 g/dL   Calcium 9.3  8.4 - 10.5 mg/dL   GFR 74.29  >60.00 mL/min  CBC WITH DIFFERENTIAL      Result Value Ref Range   WBC 5.9  4.5 - 10.5 K/uL   RBC 5.13  4.22 - 5.81 Mil/uL   Hemoglobin 14.5  13.0 - 17.0 g/dL   HCT 43.5  39.0 - 52.0 %   MCV 84.8  78.0 - 100.0 fl   MCHC 33.3  30.0 - 36.0 g/dL   RDW 13.9  11.5 - 14.6 %   Platelets 211.0  150.0 - 400.0 K/uL   Neutrophils Relative % 51.1  43.0 - 77.0 %   Lymphocytes Relative 32.5  12.0 - 46.0 %   Monocytes Relative 9.9  3.0 - 12.0 %   Eosinophils Relative 6.1 (*) 0.0 - 5.0 %   Basophils Relative 0.4  0.0 - 3.0 %   Neutro Abs 3.0  1.4 - 7.7 K/uL   Lymphs Abs 1.9  0.7 - 4.0 K/uL   Monocytes Absolute 0.6  0.1 - 1.0 K/uL   Eosinophils Absolute 0.4  0.0 - 0.7 K/uL   Basophils Absolute 0.0  0.0 - 0.1 K/uL      Assessment & Plan:   Problem List Items Addressed This Visit   GERD (gastroesophageal  reflux disease)     Prescribe omeprazole 40mg  daily for worsening GERD - pt aware of weight relation to worsening GERD sxs.    Relevant Medications      omeprazole (PRILOSEC) capsule   Health care maintenance - Primary     Preventative protocols reviewed and updated unless pt declined. Discussed  healthy diet and lifestyle.     HYPERLIPIDEMIA, MILD     Reviewed blood work with patient - discussed worsening chol levels. Discussed dietary changes to improve triglycerides..    Obesity     Body mass index is 33.01 kg/(m^2). Reviewed diet/lifestyle changes to decrease weight.    Prediabetes     Reviewed sugar and A1c.  Discussed weight loss importance.        Follow up plan: Return in about 1 year (around 11/20/2014), or as needed, for annual exam, prior fasting for blood work.

## 2013-11-19 NOTE — Progress Notes (Signed)
Pre visit review using our clinic review tool, if applicable. No additional management support is needed unless otherwise documented below in the visit note. 

## 2013-11-19 NOTE — Assessment & Plan Note (Signed)
Reviewed blood work with patient - discussed worsening chol levels. Discussed dietary changes to improve triglycerides.Marland Kitchen

## 2013-11-19 NOTE — Patient Instructions (Addendum)
Tdap today. Work on [employer] and increased activity for sustainable weight loss.  This will help sugars and will help cholesterol levels.   Prescription for omeprazole provided today - use as needed.  Nexium samples provided as well. Good to see you today, call us with questions.

## 2013-11-19 NOTE — Assessment & Plan Note (Signed)
Preventative protocols reviewed and updated unless pt declined. Discussed healthy diet and lifestyle.  

## 2014-02-28 ENCOUNTER — Ambulatory Visit: Payer: BC Managed Care – HMO | Admitting: Family Medicine

## 2014-03-09 DIAGNOSIS — D235 Other benign neoplasm of skin of trunk: Secondary | ICD-10-CM | POA: Insufficient documentation

## 2014-03-09 HISTORY — DX: Other benign neoplasm of skin of trunk: D23.5

## 2014-03-10 ENCOUNTER — Encounter: Payer: Self-pay | Admitting: Family Medicine

## 2014-03-10 ENCOUNTER — Ambulatory Visit (INDEPENDENT_AMBULATORY_CARE_PROVIDER_SITE_OTHER): Payer: BC Managed Care – HMO | Admitting: Family Medicine

## 2014-03-10 VITALS — BP 110/82 | HR 61 | Temp 98.1°F | Wt 226.0 lb

## 2014-03-10 DIAGNOSIS — D239 Other benign neoplasm of skin, unspecified: Secondary | ICD-10-CM

## 2014-03-10 NOTE — Assessment & Plan Note (Addendum)
Atypical nevus removed via shave biospy. Sent to pathology Dressed wound with abx ointment After care instructions provided to patient.

## 2014-03-10 NOTE — Addendum Note (Signed)
Addended by: Tammi Sou on: 03/10/2014 01:19 PM   Modules accepted: Orders

## 2014-03-10 NOTE — Progress Notes (Signed)
   BP 110/82  Pulse 61  Temp(Src) 98.1 F (36.7 C) (Oral)  Wt 226 lb (102.513 kg)  SpO2 97%   CC: mole removal.  Subjective:    Patient ID: Jordan Shelton, male    DOB: [birth year], 38 y.o.   MRN: [id number]  HPI: Jordan Shelton is a 38 y.o. male presenting on 03/10/2014 for Nevus   Would like to discuss mole removal today - several moles throughout body. No significant change but worried about cancer because this has been in news recently.  Sunscreen use discussed. No sunburns in past year.  Declines dermatology referral  Wt Readings from Last 3 Encounters:  03/10/14 226 lb (102.513 kg)  11/19/13 240 lb (108.863 kg)  10/21/13 235 lb (106.595 kg)  Body mass index is 31.08 kg/(m^2).  Relevant past medical, surgical, family and social history reviewed and updated as indicated.  Allergies and medications reviewed and updated. Current Outpatient Prescriptions on File Prior to Visit  Medication Sig  . Multiple Vitamin (MULTIVITAMIN) tablet Take 1 tablet by mouth daily.    Marland Kitchen omeprazole (PRILOSEC) 40 MG capsule Take 1 capsule (40 mg total) by mouth daily.   No current facility-administered medications on file prior to visit.    Review of Systems Per HPI unless specifically indicated above    Objective:    BP 110/82  Pulse 61  Temp(Src) 98.1 F (36.7 C) (Oral)  Wt 226 lb (102.513 kg)  SpO2 97%  Physical Exam  Nursing note and vitals reviewed. Constitutional: He appears well-developed and well-nourished. No distress.  Skin: Skin is warm and dry. No rash noted.  Several typical nevi throughout body, one darker nevus R upper back.   Shave biopsy Meds, vitals, and allergies reviewed.  Indication: suspicious lesion Location: R upper back Size:  Informed consent obtained and in chart.  Pt aware of risks not limited to but including infection,  bleeding, damage to near by organs. Prep: EtOH Anesthesia: 1%lidocaine with epi, good effect Shave made with dermablade Minimal oozing,  controlled with silver nitrate Tolerated well. Sent to pathology Routine postprocedure instructions d/w pt- keep area clean and bandaged, follow up if concerns/spreading erythema/pain.    Assessment & Plan:   Problem List Items Addressed This Visit   NEVUS - Primary     Atypical nevus removed via shave biospy. Sent to pathology Dressed wound with abx ointment After care instructions provided to patient.        Follow up plan: Return if symptoms worsen or fail to improve.

## 2014-03-10 NOTE — Patient Instructions (Signed)

## 2014-03-10 NOTE — Progress Notes (Signed)
Pre visit review using our clinic review tool, if applicable. No additional management support is needed unless otherwise documented below in the visit note. 

## 2014-03-16 ENCOUNTER — Encounter: Payer: Self-pay | Admitting: Family Medicine

## 2014-03-16 NOTE — Telephone Encounter (Signed)
Please see Mychart message.

## 2014-04-04 ENCOUNTER — Encounter: Payer: Self-pay | Admitting: Family Medicine

## 2014-04-04 ENCOUNTER — Ambulatory Visit (INDEPENDENT_AMBULATORY_CARE_PROVIDER_SITE_OTHER): Payer: BC Managed Care – HMO | Admitting: Family Medicine

## 2014-04-04 VITALS — BP 128/86 | HR 96 | Temp 98.4°F | Wt 225.8 lb

## 2014-04-04 DIAGNOSIS — D235 Other benign neoplasm of skin of trunk: Secondary | ICD-10-CM

## 2014-04-04 DIAGNOSIS — J069 Acute upper respiratory infection, unspecified: Secondary | ICD-10-CM

## 2014-04-04 DIAGNOSIS — B9789 Other viral agents as the cause of diseases classified elsewhere: Principal | ICD-10-CM

## 2014-04-04 MED ORDER — AZITHROMYCIN 250 MG PO TABS: ORAL_TABLET | ORAL | Status: DC

## 2014-04-04 NOTE — Assessment & Plan Note (Signed)
Anticipate viral given short duration - discussed this. Pending newborn birth - given circumstances provided with WASP for zpack with reasons to fill discussed - fever >101, worsening productive cough or worsening sinus pressure/headache/congestion. Otherwise discussed expected course of resolution. Pt agrees with plan.

## 2014-04-04 NOTE — Assessment & Plan Note (Signed)
Reviewed path with pt.

## 2014-04-04 NOTE — Patient Instructions (Signed)
Sounds like you have a viral upper respiratory infection. Antibiotics are not needed for this.  Viral infections usually take 7-10 days to resolve.  The cough can last a few weeks to go away. Should improve over next few days. If any worsening - fever >101, worsening productive cough or not improving as expected, fill zpack antibiotic (prescription provided today). Push fluids and plenty of rest. Call clinic with questions.  Good to see you today.

## 2014-04-04 NOTE — Progress Notes (Signed)
   BP 128/86  Pulse 96  Temp(Src) 98.4 F (36.9 C) (Oral)  Wt 225 lb 12 oz (102.4 kg)  SpO2 97%   CC: URI  Subjective:    Patient ID: Jordan Shelton, male    DOB: 14-Aug-1976, 38 y.o.   MRN: [id number]  HPI: Bilal Manzer is a 38 y.o. male presenting on 04/04/2014 for URI   5d h/o cough, hoarse voice, nasal congestion with mucous production. Yesterday with sinus pressure and mild headache. Feeling some better today.  PNdrainage.  No fevers/chills, abd pain, ear or tooth pain, ST.  Has self treated with mucinex DM.   No sick contacts at home. No h/o asthma, COPD. Possible h/o allergies but no recent known exposure.  Relevant past medical, surgical, family and social history reviewed and updated as indicated.  Allergies and medications reviewed and updated. Current Outpatient Prescriptions on File Prior to Visit  Medication Sig  . Multiple Vitamin (MULTIVITAMIN) tablet Take 1 tablet by mouth daily.    Marland Kitchen omeprazole (PRILOSEC) 40 MG capsule Take 1 capsule (40 mg total) by mouth daily.   No current facility-administered medications on file prior to visit.    Review of Systems Per HPI unless specifically indicated above    Objective:    BP 128/86  Pulse 96  Temp(Src) 98.4 F (36.9 C) (Oral)  Wt 225 lb 12 oz (102.4 kg)  SpO2 97%  Physical Exam  Nursing note and vitals reviewed. Constitutional: He appears well-developed and well-nourished. No distress.  HENT:  Head: Normocephalic and atraumatic.  Right Ear: Hearing, tympanic membrane, external ear and ear canal normal.  Left Ear: Hearing, tympanic membrane, external ear and ear canal normal.  Nose: Nose normal. No mucosal edema or rhinorrhea. Right sinus exhibits no maxillary sinus tenderness and no frontal sinus tenderness. Left sinus exhibits no maxillary sinus tenderness and no frontal sinus tenderness.  Mouth/Throat: Uvula is midline, oropharynx is clear and moist and mucous membranes are normal. No oropharyngeal exudate,  posterior oropharyngeal edema, posterior oropharyngeal erythema or tonsillar abscesses.  Eyes: Conjunctivae and EOM are normal. Pupils are equal, round, and reactive to light. No scleral icterus.  Neck: Normal range of motion. Neck supple.  Cardiovascular: Normal rate, regular rhythm, normal heart sounds and intact distal pulses.   No murmur heard. Pulmonary/Chest: Effort normal and breath sounds normal. No respiratory distress. He has no wheezes. He has no rales.  Lymphadenopathy:    He has no cervical adenopathy.  Skin: Skin is warm and dry. No rash noted.       Assessment & Plan:   Problem List Items Addressed This Visit   Viral URI with cough - Primary     Anticipate viral given short duration - discussed this. Pending newborn birth - given circumstances provided with WASP for zpack with reasons to fill discussed - fever >101, worsening productive cough or worsening sinus pressure/headache/congestion. Otherwise discussed expected course of resolution. Pt agrees with plan.    Relevant Medications      azithromycin (ZITHROMAX) tablet   Dysplastic nevus of trunk     Reviewed path with pt.        Follow up plan: Return if symptoms worsen or fail to improve.

## 2014-04-04 NOTE — Progress Notes (Signed)
Pre visit review using our clinic review tool, if applicable. No additional management support is needed unless otherwise documented below in the visit note. 

## 2014-08-20 ENCOUNTER — Other Ambulatory Visit: Payer: Self-pay | Admitting: Family Medicine

## 2015-02-02 ENCOUNTER — Ambulatory Visit (INDEPENDENT_AMBULATORY_CARE_PROVIDER_SITE_OTHER): Payer: Self-pay | Admitting: Primary Care

## 2015-02-02 ENCOUNTER — Encounter: Payer: Self-pay | Admitting: Primary Care

## 2015-02-02 VITALS — BP 128/82 | HR 101 | Temp 98.4°F | Ht 71.5 in | Wt 229.4 lb

## 2015-02-02 DIAGNOSIS — R05 Cough: Secondary | ICD-10-CM

## 2015-02-02 DIAGNOSIS — J069 Acute upper respiratory infection, unspecified: Secondary | ICD-10-CM | POA: Diagnosis not present

## 2015-02-02 DIAGNOSIS — J029 Acute pharyngitis, unspecified: Secondary | ICD-10-CM | POA: Diagnosis not present

## 2015-02-02 DIAGNOSIS — B9789 Other viral agents as the cause of diseases classified elsewhere: Secondary | ICD-10-CM

## 2015-02-02 DIAGNOSIS — R059 Cough, unspecified: Secondary | ICD-10-CM

## 2015-02-02 LAB — POCT INFLUENZA A/B
INFLUENZA A, POC: NEGATIVE
INFLUENZA B, POC: NEGATIVE

## 2015-02-02 LAB — POCT RAPID STREP A (OFFICE): Rapid Strep A Screen: NEGATIVE

## 2015-02-02 MED ORDER — HYDROCODONE-HOMATROPINE 5-1.5 MG/5ML PO SYRP: 5.0000 mL | ORAL_SOLUTION | Freq: Every evening | ORAL | Status: DC | PRN

## 2015-02-02 MED ORDER — AZITHROMYCIN 250 MG PO TABS: ORAL_TABLET | ORAL | Status: DC

## 2015-02-02 MED ORDER — FLUTICASONE PROPIONATE 50 MCG/ACT NA SUSP: 2.0000 | Freq: Every day | NASAL | Status: DC

## 2015-02-02 NOTE — Progress Notes (Signed)
Subjective:    Patient ID: Jordan Shelton, male    DOB: 1975/12/10, 39 y.o.   MRN: [id number]  HPI  Jordan Shelton is a 39 year old male who presents today with a chief complaint of nasal congestion and cough. He also reports of a fever (highest of 100.7), chills, and night sweats. He did not have a fever today. His symptoms began on Sunday and was feeling better until last night when he developed more chills. He's been taking Mucinex DM with some relief. His cough is productive in the morning and is worst in the morning and throughout the night. He and is family just returned from a 10 day trip to [place], and his daughter has recently been ill with the same symptoms but is now improving.   Review of Systems  Constitutional: Positive for fever and chills.  HENT: Positive for congestion, postnasal drip, sinus pressure and sore throat. Negative for ear pain.   Respiratory: Positive for cough. Negative for shortness of breath.   Cardiovascular: Negative for chest pain.  Gastrointestinal: Negative for nausea and vomiting.  Musculoskeletal: Negative for myalgias.       Past Medical History  Diagnosis Date  . GERD (gastroesophageal reflux disease)   . History of chickenpox   . Dysplastic nevus of trunk 03/2014    mod melanocytic atypia, margins free    History   Social History  . Marital Status: Single    Spouse Name: N/A  . Number of Children: N/A  . Years of Education: N/A   Occupational History  . Scientist, research (medical)     [employer]   Social History Main Topics  . Smoking status: Former Smoker    Quit date: 09/10/2003  . Smokeless tobacco: Never Used  . Alcohol Use: 0.0 oz/week    0 Standard drinks or equivalent per week     Comment: Social  . Drug Use: No  . Sexual Activity: Not on file   Other Topics Concern  . Not on file   Social History Narrative   Lives with wife and daughter.  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    Past Surgical History  Procedure Laterality Date  . Mole removal    . Cystectomy      benign  . Abd [place]  06/2011    fatty liver    Family History  Problem Relation Age of Onset  . Prostate cancer Father 18    non agressive  . Hypertension Father   . Crohn's disease Mother   . Lupus Mother   . Stroke Maternal Grandmother     No Known Allergies  Current Outpatient Prescriptions on File Prior to Visit  Medication Sig Dispense Refill  . Multiple Vitamin (MULTIVITAMIN) tablet Take 1 tablet by mouth daily.      Marland Kitchen omeprazole (PRILOSEC) 40 MG capsule TAKE 1 CAPSULE BY MOUTH DAILY. 30 capsule 3   No current facility-administered medications on file prior to visit.    BP 128/82 mmHg  Pulse 101  Temp(Src) 98.4 F (36.9 C) (Oral)  Ht 5' 11.5" (1.816 m)  Wt 229 lb 6.4 oz (104.055 kg)  BMI 31.55 kg/m2  SpO2 98%    Objective:   Physical Exam  Constitutional: He appears ill.  HENT:  Right Ear: Tympanic membrane and ear canal normal.  Left  Ear: Ear canal normal. A middle ear effusion is present.  Mouth/Throat: Posterior oropharyngeal edema and posterior oropharyngeal erythema present. No oropharyngeal exudate.  Eyes: Conjunctivae are normal. Pupils are equal, round, and reactive to light.  Neck: Neck supple.  Cardiovascular: Normal rate and regular rhythm.   Pulmonary/Chest: Effort normal and breath sounds normal.  Lymphadenopathy:    He has no cervical adenopathy.  Skin: Skin is warm.  Skin slightly diaphoretic          Assessment & Plan:

## 2015-02-02 NOTE — Progress Notes (Signed)
Pre visit review using our clinic review tool, if applicable. No additional management support is needed unless otherwise documented below in the visit note. 

## 2015-02-02 NOTE — Patient Instructions (Signed)
Your strep and influenza tests were negative. You likely have a Viral upper respiratory tract infection that should resolve on its own. Start antibiotics Sunday if no improvement or worsening symptoms. You may take Delsym during the day for cough and Hycodan at night.  Upper Respiratory Infection, Adult An upper respiratory infection (URI) is also sometimes known as the common cold. The upper respiratory tract includes the nose, sinuses, throat, trachea, and bronchi. Bronchi are the airways leading to the lungs. Most people improve within 1 week, but symptoms can last up to 2 weeks. A residual cough may last even longer.  CAUSES Many different viruses can infect the tissues lining the upper respiratory tract. The tissues become irritated and inflamed and often become very moist. Mucus production is also common. A cold is contagious. You can easily spread the virus to others by oral contact. This includes kissing, sharing a glass, coughing, or sneezing. Touching your mouth or nose and then touching a surface, which is then touched by another person, can also spread the virus. SYMPTOMS  Symptoms typically develop 1 to 3 days after you come in contact with a cold virus. Symptoms vary from person to person. They may include:  Runny nose.  Sneezing.  Nasal congestion.  Sinus irritation.  Sore throat.  Loss of voice (laryngitis).  Cough.  Fatigue.  Muscle aches.  Loss of appetite.  Headache.  Low-grade fever. DIAGNOSIS  You might diagnose your own cold based on familiar symptoms, since most people get a cold 2 to 3 times a year. Your caregiver can confirm this based on your exam. Most importantly, your caregiver can check that your symptoms are not due to another disease such as strep throat, sinusitis, pneumonia, asthma, or epiglottitis. Blood tests, throat tests, and X-rays are not necessary to diagnose a common cold, but they may sometimes be helpful in excluding other more serious  diseases. Your caregiver will decide if any further tests are required. RISKS AND COMPLICATIONS  You may be at risk for a more severe case of the common cold if you smoke cigarettes, have chronic heart disease (such as heart failure) or lung disease (such as asthma), or if you have a weakened immune system. The very young and very old are also at risk for more serious infections. Bacterial sinusitis, middle ear infections, and bacterial pneumonia can complicate the common cold. The common cold can worsen asthma and chronic obstructive pulmonary disease (COPD). Sometimes, these complications can require emergency medical care and may be life-threatening. PREVENTION  The best way to protect against getting a cold is to practice good hygiene. Avoid oral or hand contact with people with cold symptoms. Wash your hands often if contact occurs. There is no clear evidence that vitamin C, vitamin E, echinacea, or exercise reduces the chance of developing a cold. However, it is always recommended to get plenty of rest and practice good nutrition. TREATMENT  Treatment is directed at relieving symptoms. There is no cure. Antibiotics are not effective, because the infection is caused by a virus, not by bacteria. Treatment may include:  Increased fluid intake. Sports drinks offer valuable electrolytes, sugars, and fluids.  Breathing heated mist or steam (vaporizer or shower).  Eating chicken soup or other clear broths, and maintaining good nutrition.  Getting plenty of rest.  Using gargles or lozenges for comfort.  Controlling fevers with ibuprofen or acetaminophen as directed by your caregiver.  Increasing usage of your inhaler if you have asthma. Zinc gel and zinc lozenges, taken  in the first 24 hours of the common cold, can shorten the duration and lessen the severity of symptoms. Pain medicines may help with fever, muscle aches, and throat pain. A variety of non-prescription medicines are available to  treat congestion and runny nose. Your caregiver can make recommendations and may suggest nasal or lung inhalers for other symptoms.  HOME CARE INSTRUCTIONS   Only take over-the-counter or prescription medicines for pain, discomfort, or fever as directed by your caregiver.  Use a warm mist humidifier or inhale steam from a shower to increase air moisture. This may keep secretions moist and make it easier to breathe.  Drink enough water and fluids to keep your urine clear or pale yellow.  Rest as needed.  Return to work when your temperature has returned to normal or as your caregiver advises. You may need to stay home longer to avoid infecting others. You can also use a face mask and careful hand washing to prevent spread of the virus. SEEK MEDICAL CARE IF:   After the first few days, you feel you are getting worse rather than better.  You need your caregiver's advice about medicines to control symptoms.  You develop chills, worsening shortness of breath, or brown or red sputum. These may be signs of pneumonia.  You develop yellow or brown nasal discharge or pain in the face, especially when you bend forward. These may be signs of sinusitis.  You develop a fever, swollen neck glands, pain with swallowing, or white areas in the back of your throat. These may be signs of strep throat. SEEK IMMEDIATE MEDICAL CARE IF:   You have a fever.  You develop severe or persistent headache, ear pain, sinus pain, or chest pain.  You develop wheezing, a prolonged cough, cough up blood, or have a change in your usual mucus (if you have chronic lung disease).  You develop sore muscles or a stiff neck. Document Released: 02/19/2001 Document Revised: 11/18/2011 Document Reviewed: 12/01/2013 [facility] Patient Information 2015 [place], [place]. This information is not intended to replace advice given to you by your health care provider. Make sure you discuss any questions you have with your health care  provider.

## 2015-02-02 NOTE — Assessment & Plan Note (Addendum)
Suspect viral involvement due to duration and symptoms. Rapid strep negative, influenza negative. Overall improving, supportive treatment provided. Ibuprofen, Delsym for day cough, Hycodan at night. Due to newborn will print RX for Zpak to hold until Sunday if no improvement or worsening fevers.

## 2015-05-24 ENCOUNTER — Other Ambulatory Visit: Payer: Self-pay | Admitting: Family Medicine

## 2015-08-30 ENCOUNTER — Ambulatory Visit (INDEPENDENT_AMBULATORY_CARE_PROVIDER_SITE_OTHER): Payer: BLUE CROSS/BLUE SHIELD

## 2015-08-30 DIAGNOSIS — Z23 Encounter for immunization: Secondary | ICD-10-CM | POA: Diagnosis not present

## 2015-09-08 ENCOUNTER — Other Ambulatory Visit: Payer: Self-pay | Admitting: Family Medicine

## 2015-09-08 NOTE — Telephone Encounter (Signed)
CPE required for additional refills, per chart

## 2015-09-27 ENCOUNTER — Other Ambulatory Visit: Payer: Self-pay | Admitting: Family Medicine

## 2015-09-27 DIAGNOSIS — E785 Hyperlipidemia, unspecified: Secondary | ICD-10-CM

## 2015-09-27 DIAGNOSIS — R7303 Prediabetes: Secondary | ICD-10-CM

## 2015-09-27 DIAGNOSIS — K760 Fatty (change of) liver, not elsewhere classified: Secondary | ICD-10-CM

## 2015-09-28 ENCOUNTER — Other Ambulatory Visit (INDEPENDENT_AMBULATORY_CARE_PROVIDER_SITE_OTHER): Payer: BLUE CROSS/BLUE SHIELD

## 2015-09-28 DIAGNOSIS — K760 Fatty (change of) liver, not elsewhere classified: Secondary | ICD-10-CM | POA: Diagnosis not present

## 2015-09-28 DIAGNOSIS — E785 Hyperlipidemia, unspecified: Secondary | ICD-10-CM | POA: Diagnosis not present

## 2015-09-28 DIAGNOSIS — R7303 Prediabetes: Secondary | ICD-10-CM | POA: Diagnosis not present

## 2015-09-28 LAB — COMPREHENSIVE METABOLIC PANEL
ALT: 40 U/L (ref 0–53)
AST: 28 U/L (ref 0–37)
Albumin: 4.3 g/dL (ref 3.5–5.2)
Alkaline Phosphatase: 57 U/L (ref 39–117)
BILIRUBIN TOTAL: 0.4 mg/dL (ref 0.2–1.2)
BUN: 13 mg/dL (ref 6–23)
CO2: 28 meq/L (ref 19–32)
CREATININE: 1.01 mg/dL (ref 0.40–1.50)
Calcium: 9.6 mg/dL (ref 8.4–10.5)
Chloride: 103 mEq/L (ref 96–112)
GFR: 87.98 mL/min (ref >60.00)
GLUCOSE: 110 mg/dL — AB (ref 70–99)
Potassium: 4.2 mEq/L (ref 3.5–5.1)
Sodium: 138 mEq/L (ref 135–145)
Total Protein: 7.4 g/dL (ref 6.0–8.3)

## 2015-09-28 LAB — LIPID PANEL
CHOL/HDL RATIO: 6
CHOLESTEROL: 250 mg/dL — AB (ref 0–200)
HDL: 43.4 mg/dL (ref >39.00)
NONHDL: 206.29
Triglycerides: 291 mg/dL — ABNORMAL HIGH (ref 0.0–149.0)
VLDL: 58.2 mg/dL — ABNORMAL HIGH (ref 0.0–40.0)

## 2015-09-28 LAB — LDL CHOLESTEROL, DIRECT: Direct LDL: 148 mg/dL

## 2015-09-28 LAB — TSH: TSH: 1.76 u[IU]/mL (ref 0.35–4.50)

## 2015-09-28 LAB — HEMOGLOBIN A1C: Hgb A1c MFr Bld: 6 % (ref 4.6–6.5)

## 2015-10-05 ENCOUNTER — Ambulatory Visit (INDEPENDENT_AMBULATORY_CARE_PROVIDER_SITE_OTHER): Payer: BLUE CROSS/BLUE SHIELD | Admitting: Family Medicine

## 2015-10-05 ENCOUNTER — Encounter: Payer: Self-pay | Admitting: Family Medicine

## 2015-10-05 VITALS — BP 112/80 | HR 86 | Temp 98.1°F | Ht 72.0 in | Wt 232.8 lb

## 2015-10-05 DIAGNOSIS — Z0000 Encounter for general adult medical examination without abnormal findings: Secondary | ICD-10-CM

## 2015-10-05 DIAGNOSIS — K219 Gastro-esophageal reflux disease without esophagitis: Secondary | ICD-10-CM | POA: Diagnosis not present

## 2015-10-05 DIAGNOSIS — E669 Obesity, unspecified: Secondary | ICD-10-CM

## 2015-10-05 DIAGNOSIS — E785 Hyperlipidemia, unspecified: Secondary | ICD-10-CM | POA: Diagnosis not present

## 2015-10-05 DIAGNOSIS — R7303 Prediabetes: Secondary | ICD-10-CM

## 2015-10-05 MED ORDER — OMEPRAZOLE 40 MG PO CPDR: DELAYED_RELEASE_CAPSULE | ORAL | Status: DC

## 2015-10-05 NOTE — Assessment & Plan Note (Signed)
Reviewed A1c with patient, discussed importance of weight loss

## 2015-10-05 NOTE — Assessment & Plan Note (Addendum)
sxs controlled with PPI PRN. Refilled. Discussed dietary changes to control sxs as well as importance of weight loss.

## 2015-10-05 NOTE — Assessment & Plan Note (Signed)
Chronic, deteriorated. Discussed healthy diet changes to improve chol levels. Provided with low chol diet handout.

## 2015-10-05 NOTE — Assessment & Plan Note (Signed)
Discussed healthy diet and lifestyle changes to affect sustainable weight loss  

## 2015-10-05 NOTE — Patient Instructions (Addendum)
Work on cholesterol levels and sugar. Good to see you today, call us with questions. Return as needed or in 1 year for next physical. Low chol diet handout provided today.  Health Maintenance, Male A healthy lifestyle and preventative care can promote health and wellness.  Maintain regular health, dental, and eye exams.  Eat a healthy diet. Foods like vegetables, fruits, whole grains, low-fat dairy products, and lean protein foods contain the nutrients you need and are low in calories. Decrease your intake of foods high in solid fats, added sugars, and salt. Get information about a proper diet from your health care provider, if necessary.  Regular physical exercise is one of the most important things you can do for your health. Most adults should get at least 150 minutes of moderate-intensity exercise (any activity that increases your heart rate and causes you to sweat) each week. In addition, most adults need muscle-strengthening exercises on 2 or more days a week.   Maintain a healthy weight. The body mass index (BMI) is a screening tool to identify possible weight problems. It provides an estimate of body fat based on height and weight. Your health care provider can find your BMI and can help you achieve or maintain a healthy weight. For males 20 years and older:  A BMI below 18.5 is considered underweight.  A BMI of 18.5 to 24.9 is normal.  A BMI of 25 to 29.9 is considered overweight.  A BMI of 30 and above is considered obese.  Maintain normal blood lipids and cholesterol by exercising and minimizing your intake of saturated fat. Eat a balanced diet with plenty of fruits and vegetables. Blood tests for lipids and cholesterol should begin at age 11 and be repeated every 5 years. If your lipid or cholesterol levels are high, you are over age 25, or you are at high risk for heart disease, you may need your cholesterol levels checked more frequently.Ongoing high lipid and cholesterol  levels should be treated with medicines if diet and exercise are not working.  If you smoke, find out from your health care provider how to quit. If you do not use tobacco, do not start.  Lung cancer screening is recommended for adults aged 58-80 years who are at high risk for developing lung cancer because of a history of smoking. A yearly low-dose CT scan of the lungs is recommended for people who have at least a 30-pack-year history of smoking and are current smokers or have quit within the past 15 years. A pack year of smoking is smoking an average of 1 pack of cigarettes a day for 1 year (for example, a 30-pack-year history of smoking could mean smoking 1 pack a day for 30 years or 2 packs a day for 15 years). Yearly screening should continue until the smoker has stopped smoking for at least 15 years. Yearly screening should be stopped for people who develop a health problem that would prevent them from having lung cancer treatment.  If you choose to drink alcohol, do not have more than 2 drinks per day. One drink is considered to be 12 oz (360 mL) of beer, 5 oz (150 mL) of wine, or 1.5 oz (45 mL) of liquor.  Avoid the use of street drugs. Do not share needles with anyone. Ask for help if you need support or instructions about stopping the use of drugs.  High blood pressure causes heart disease and increases the risk of stroke. High blood pressure is more likely to  develop in:  People who have blood pressure in the end of the normal range (100-139/85-89 mm Hg).  People who are overweight or obese.  People who are African American.  If you are 60-74 years of age, have your blood pressure checked every 3-5 years. If you are 16 years of age or older, have your blood pressure checked every year. You should have your blood pressure measured twice--once when you are at a hospital or clinic, and once when you are not at a hospital or clinic. Record the average of the two measurements. To check your  blood pressure when you are not at a hospital or clinic, you can use:  An automated blood pressure machine at a pharmacy.  A home blood pressure monitor.  If you are 81-19 years old, ask your health care provider if you should take aspirin to prevent heart disease.  Diabetes screening involves taking a blood sample to check your fasting blood sugar level. This should be done once every 3 years after age 83 if you are at a normal weight and without risk factors for diabetes. Testing should be considered at a younger age or be carried out more frequently if you are overweight and have at least 1 risk factor for diabetes.  Colorectal cancer can be detected and often prevented. Most routine colorectal cancer screening begins at the age of 25 and continues through age 92. However, your health care provider may recommend screening at an earlier age if you have risk factors for colon cancer. On a yearly basis, your health care provider may provide home test kits to check for hidden blood in the stool. A small camera at the end of a tube may be used to directly examine the colon (sigmoidoscopy or colonoscopy) to detect the earliest forms of colorectal cancer. Talk to your health care provider about this at age 33 when routine screening begins. A direct exam of the colon should be repeated every 5-10 years through age 33, unless early forms of precancerous polyps or small growths are found.  People who are at an increased risk for hepatitis B should be screened for this virus. You are considered at high risk for hepatitis B if:  You were born in a country where hepatitis B occurs often. Talk with your health care provider about which countries are considered high risk.  Your parents were born in a high-risk country and you have not received a shot to protect against hepatitis B (hepatitis B vaccine).  You have HIV or AIDS.  You use needles to inject street drugs.  You live with, or have sex with,  someone who has hepatitis B.  You are a man who has sex with other men (MSM).  You get hemodialysis treatment.  You take certain medicines for conditions like cancer, organ transplantation, and autoimmune conditions.  Hepatitis C blood testing is recommended for all people born from [birth year] through 1963 and any individual with known risk factors for hepatitis C.  Healthy men should no longer receive prostate-specific antigen (PSA) blood tests as part of routine cancer screening. Talk to your health care provider about prostate cancer screening.  Testicular cancer screening is not recommended for adolescents or adult males who have no symptoms. Screening includes self-exam, a health care provider exam, and other screening tests. Consult with your health care provider about any symptoms you have or any concerns you have about testicular cancer.  Practice safe sex. Use condoms and avoid high-risk sexual practices to reduce  the spread of sexually transmitted infections (STIs).  You should be screened for STIs, including gonorrhea and chlamydia if:  You are sexually active and are younger than 24 years.  You are older than 24 years, and your health care provider tells you that you are at risk for this type of infection.  Your sexual activity has changed since you were last screened, and you are at an increased risk for chlamydia or gonorrhea. Ask your health care provider if you are at risk.  If you are at risk of being infected with HIV, it is recommended that you take a prescription medicine daily to prevent HIV infection. This is called pre-exposure prophylaxis (PrEP). You are considered at risk if:  You are a man who has sex with other men (MSM).  You are a heterosexual man who is sexually active with multiple partners.  You take drugs by injection.  You are sexually active with a partner who has HIV.  Talk with your health care provider about whether you are at high risk of being  infected with HIV. If you choose to begin PrEP, you should first be tested for HIV. You should then be tested every 3 months for as long as you are taking PrEP.  Use sunscreen. Apply sunscreen liberally and repeatedly throughout the day. You should seek shade when your shadow is shorter than you. Protect yourself by wearing long sleeves, pants, a wide-brimmed hat, and sunglasses year round whenever you are outdoors.  Tell your health care provider of new moles or changes in moles, especially if there is a change in shape or color. Also, tell your health care provider if a mole is larger than the size of a pencil eraser.  A one-time screening for abdominal aortic aneurysm (AAA) and surgical repair of large AAAs by ultrasound is recommended for men aged 58-75 years who are current or former smokers.  Stay current with your vaccines (immunizations).   This information is not intended to replace advice given to you by your health care provider. Make sure you discuss any questions you have with your health care provider.   Document Released: 02/22/2008 Document Revised: 09/16/2014 Document Reviewed: 01/21/2011 Elsevier Interactive Patient Education [employer].

## 2015-10-05 NOTE — Progress Notes (Signed)
Pre visit review using our clinic review tool, if applicable. No additional management support is needed unless otherwise documented below in the visit note. 

## 2015-10-05 NOTE — Progress Notes (Signed)
BP 112/80 mmHg  Pulse 86  Temp(Src) 98.1 F (36.7 C) (Oral)  Ht 6' (1.829 m)  Wt 232 lb 12 oz (105.575 kg)  BMI 31.56 kg/m2  SpO2 96%   CC: CPE  Subjective:    Patient ID: Jordan Shelton, male    DOB: 02/15/1976, 40 y.o.   MRN: [id number]  HPI: Jordan Shelton is a 40 y.o. male presenting on 10/05/2015 for Annual Exam   Recent trip to [place].  GERD - requests refill. Takes omeprazole 40mg  several times a week which controls symptoms.   Preventative: Flu - yearly Tdap 2015 Seat belt use discussed Sunscreen use discussed. No changing moles on skin.  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  Relevant past medical, surgical, family and social history reviewed and updated as indicated. Interim medical history since our last visit reviewed. Allergies and medications reviewed and updated. No current outpatient prescriptions on file prior to visit.   No current facility-administered medications on file prior to visit.    Review of Systems  Constitutional: Negative for fever, chills, activity change, appetite change, fatigue and unexpected weight change.  HENT: Positive for congestion. Negative for hearing loss.        Recent URI  Eyes: Negative for visual disturbance.  Respiratory: Positive for cough. Negative for chest tightness, shortness of breath and wheezing.   Cardiovascular: Negative for chest pain, palpitations and leg swelling.  Gastrointestinal: Positive for diarrhea. Negative for nausea, vomiting, abdominal pain, constipation, blood in stool and abdominal distention.       Recent stomach bug  Genitourinary: Negative for hematuria and difficulty urinating.  Musculoskeletal: Negative for myalgias, arthralgias and neck pain.  Skin: Negative for rash.  Neurological: Negative for dizziness, seizures, syncope and headaches.   Hematological: Negative for adenopathy. Does not bruise/bleed easily.  Psychiatric/Behavioral: Negative for dysphoric mood. The patient is not nervous/anxious.    Per HPI unless specifically indicated in ROS section     Objective:    BP 112/80 mmHg  Pulse 86  Temp(Src) 98.1 F (36.7 C) (Oral)  Ht 6' (1.829 m)  Wt 232 lb 12 oz (105.575 kg)  BMI 31.56 kg/m2  SpO2 96%  Wt Readings from Last 3 Encounters:  10/05/15 232 lb 12 oz (105.575 kg)  02/02/15 229 lb 6.4 oz (104.055 kg)  04/04/14 225 lb 12 oz (102.4 kg)    Physical Exam  Constitutional: He is oriented to person, place, and time. He appears well-developed and well-nourished. No distress.  HENT:  Head: Normocephalic and atraumatic.  Right Ear: Hearing, tympanic membrane, external ear and ear canal normal.  Left Ear: Hearing, tympanic membrane, external ear and ear canal normal.  Nose: Nose normal.  Mouth/Throat: Uvula is midline, oropharynx is clear and moist and mucous membranes are normal. No oropharyngeal exudate, posterior oropharyngeal edema or posterior oropharyngeal erythema.  Eyes: Conjunctivae and EOM are normal. Pupils are equal, round, and reactive to light. No scleral icterus.  Neck: Normal range of motion. Neck supple. No thyromegaly present.  Cardiovascular: Normal rate, regular rhythm, normal heart sounds and intact distal pulses.   No murmur heard. Pulses:      Radial pulses are 2+ on the right side, and 2+ on the left side.  Pulmonary/Chest: Effort normal and breath sounds normal. No respiratory distress. He has no wheezes. He has no rales.  Abdominal: Soft. Bowel sounds are  normal. He exhibits no distension and no mass. There is no tenderness. There is no rebound and no guarding.  Musculoskeletal: Normal range of motion. He exhibits no edema.  Lymphadenopathy:    He has no cervical adenopathy.  Neurological: He is alert and oriented to person, place, and time.  CN grossly intact, station and gait intact    Skin: Skin is warm and dry. No rash noted.  Psychiatric: He has a normal mood and affect. His behavior is normal. Judgment and thought content normal.  Nursing note and vitals reviewed.  Results for orders placed or performed in visit on 09/28/15  Lipid panel  Result Value Ref Range   Cholesterol 250 (H) 0 - 200 mg/dL   Triglycerides 291.0 (H) 0.0 - 149.0 mg/dL   HDL 43.40 >39.00 mg/dL   VLDL 58.2 (H) 0.0 - 40.0 mg/dL   Total CHOL/HDL Ratio 6    NonHDL 206.29   Comprehensive metabolic panel  Result Value Ref Range   Sodium 138 135 - 145 mEq/L   Potassium 4.2 3.5 - 5.1 mEq/L   Chloride 103 96 - 112 mEq/L   CO2 28 19 - 32 mEq/L   Glucose, Bld 110 (H) 70 - 99 mg/dL   BUN 13 6 - 23 mg/dL   Creatinine, Ser 1.01 0.40 - 1.50 mg/dL   Total Bilirubin 0.4 0.2 - 1.2 mg/dL   Alkaline Phosphatase 57 39 - 117 U/L   AST 28 0 - 37 U/L   ALT 40 0 - 53 U/L   Total Protein 7.4 6.0 - 8.3 g/dL   Albumin 4.3 3.5 - 5.2 g/dL   Calcium 9.6 8.4 - 10.5 mg/dL   GFR 87.98 >60.00 mL/min  TSH  Result Value Ref Range   TSH 1.76 0.35 - 4.50 uIU/mL  Hemoglobin A1c  Result Value Ref Range   Hgb A1c MFr Bld 6.0 4.6 - 6.5 %  LDL cholesterol, direct  Result Value Ref Range   Direct LDL 148.0 mg/dL      Assessment & Plan:   Problem List Items Addressed This Visit    Prediabetes    Reviewed A1c with patient, discussed importance of weight loss      Obesity, Class I, BMI 30-34.9    Discussed healthy diet and lifestyle changes to affect sustainable weight loss.      HLD (hyperlipidemia)    Chronic, deteriorated. Discussed healthy diet changes to improve chol levels. Provided with low chol diet handout.      Health care maintenance - Primary    Preventative protocols reviewed and updated unless pt declined. Discussed healthy diet and lifestyle.       GERD (gastroesophageal reflux disease)    sxs controlled with PPI PRN. Refilled. Discussed dietary changes to control sxs as well as importance of  weight loss.      Relevant Medications   omeprazole (PRILOSEC) 40 MG capsule       Follow up plan: Return in about 1 year (around 10/04/2016), or as needed, for annual exam, prior fasting for blood work.

## 2015-10-05 NOTE — Assessment & Plan Note (Signed)
Preventative protocols reviewed and updated unless pt declined. Discussed healthy diet and lifestyle.  

## 2015-10-09 NOTE — Addendum Note (Signed)
Addended by: Ria Bush on: 10/09/2015 08:15 AM   Modules accepted: Miquel Dunn

## 2016-05-28 ENCOUNTER — Encounter: Payer: Self-pay | Admitting: Family Medicine

## 2016-05-28 ENCOUNTER — Ambulatory Visit (INDEPENDENT_AMBULATORY_CARE_PROVIDER_SITE_OTHER): Payer: BLUE CROSS/BLUE SHIELD | Admitting: Family Medicine

## 2016-05-28 VITALS — BP 122/78 | Temp 98.2°F | Wt 241.2 lb

## 2016-05-28 DIAGNOSIS — L74519 Primary focal hyperhidrosis, unspecified: Secondary | ICD-10-CM

## 2016-05-28 DIAGNOSIS — M7051 Other bursitis of knee, right knee: Secondary | ICD-10-CM | POA: Diagnosis not present

## 2016-05-28 DIAGNOSIS — R61 Generalized hyperhidrosis: Secondary | ICD-10-CM

## 2016-05-28 MED ORDER — DOXYCYCLINE HYCLATE 100 MG PO TABS: 100.0000 mg | ORAL_TABLET | Freq: Two times a day (BID) | ORAL | 0 refills | Status: DC

## 2016-05-28 MED ORDER — AMOXICILLIN-POT CLAVULANATE 875-125 MG PO TABS: 1.0000 | ORAL_TABLET | Freq: Two times a day (BID) | ORAL | 0 refills | Status: DC

## 2016-05-28 NOTE — Progress Notes (Signed)
BP 122/78   Temp 98.2 F (36.8 C) (Oral)   Wt 241 lb 4 oz (109.4 kg)   BMI 32.72 kg/m    CC: knee pain Subjective:    Patient ID: Jordan Shelton, male    DOB: 1976/01/10, 40 y.o.   MRN: [id number]  HPI: Ermon Sagan is a 40 y.o. male presenting on 05/28/2016 for Knee Pain   Last semester of grad school.   10d h/o R knee pain/swelling and redness with warmth of skin. Points to anterior knee as main site of pain. Acutely worse Monday morning - trouble with ambulation. Today felt better after he took some left over abx for dental procedure (augmentin 800/125 - took 3 pills). Has also taken 417m ibuprofen at home - last this morning.   Denies inciting trauma or injury. No fevers/chills, nausea.  No bug bites.  No h/o skin cellulitis. No h/o gout.  Occasional dry skin patches on knees.   Relevant past medical, surgical, family and social history reviewed and updated as indicated. Interim medical history since our last visit reviewed. Allergies and medications reviewed and updated. Current Outpatient Prescriptions on File Prior to Visit  Medication Sig  . Multiple Vitamin (MULTIVITAMIN) tablet Take 1 tablet by mouth daily.  .Marland Kitchenomeprazole (PRILOSEC) 40 MG capsule Take one capsule daily   No current facility-administered medications on file prior to visit.     Review of Systems Per HPI unless specifically indicated in ROS section     Objective:    BP 122/78   Temp 98.2 F (36.8 C) (Oral)   Wt 241 lb 4 oz (109.4 kg)   BMI 32.72 kg/m   Wt Readings from Last 3 Encounters:  05/28/16 241 lb 4 oz (109.4 kg)  10/05/15 232 lb 12 oz (105.6 kg)  02/02/15 229 lb 6.4 oz (104.1 kg)    Physical Exam  Constitutional: He appears well-developed and well-nourished. No distress.  Musculoskeletal: Normal range of motion. He exhibits no edema.  L knee WNL R knee markedly tender to palpation at suprapatellar bursa with erythema/warmth spreading inferiorly. Erythema delineated.  FROM in  flexion/extension of knee.  No pain with Mcmurray test.  Skin: Skin is warm and dry. There is erythema.  Nursing note and vitals reviewed.  Results for orders placed or performed in visit on 09/28/15  Lipid panel  Result Value Ref Range   Cholesterol 250 (H) 0 - 200 mg/dL   Triglycerides 291.0 (H) 0.0 - 149.0 mg/dL   HDL 43.40 >39.00 mg/dL   VLDL 58.2 (H) 0.0 - 40.0 mg/dL   Total CHOL/HDL Ratio 6    NonHDL 206.29   Comprehensive metabolic panel  Result Value Ref Range   Sodium 138 135 - 145 mEq/L   Potassium 4.2 3.5 - 5.1 mEq/L   Chloride 103 96 - 112 mEq/L   CO2 28 19 - 32 mEq/L   Glucose, Bld 110 (H) 70 - 99 mg/dL   BUN 13 6 - 23 mg/dL   Creatinine, Ser 1.01 0.40 - 1.50 mg/dL   Total Bilirubin 0.4 0.2 - 1.2 mg/dL   Alkaline Phosphatase 57 39 - 117 U/L   AST 28 0 - 37 U/L   ALT 40 0 - 53 U/L   Total Protein 7.4 6.0 - 8.3 g/dL   Albumin 4.3 3.5 - 5.2 g/dL   Calcium 9.6 8.4 - 10.5 mg/dL   GFR 87.98 >60.00 mL/min  TSH  Result Value Ref Range   TSH 1.76 0.35 - 4.50 uIU/mL  Hemoglobin A1c  Result Value Ref Range   Hgb A1c MFr Bld 6.0 4.6 - 6.5 %  LDL cholesterol, direct  Result Value Ref Range   Direct LDL 148.0 mg/dL      Assessment & Plan:   Problem List Items Addressed This Visit    Hyperhidrosis    Check TSH Consider switch from omeprazole to pantoprazole.      Relevant Orders   TSH   Suprapatellar bursitis of right knee - Primary    New and improved since augmentin course started - concern for septic bursitis vs other bursitis. No obvious effusion or fluctuance to collect so aspiration not performed. Check labs today (CBC, ESR, urate). Pt had 3 doses of augmentin with marked improvement noted. Will finish augmentin 7d course, will add doxycycline to cover for possible MRSA. Red flags to seek urgent care discussed.      Relevant Orders   CBC with Differential/Platelet   Uric acid   Sedimentation rate    Other Visit Diagnoses   None.      Follow up  plan: Return if symptoms worsen or fail to improve.  Javier Gutierrez, MD   

## 2016-05-28 NOTE — Assessment & Plan Note (Signed)
New and improved since augmentin course started - concern for septic bursitis vs other bursitis. No obvious effusion or fluctuance to collect so aspiration not performed. Check labs today (CBC, ESR, urate). Pt had 3 doses of augmentin with marked improvement noted. Will finish augmentin 7d course, will add doxycycline to cover for possible MRSA. Red flags to seek urgent care discussed.

## 2016-05-28 NOTE — Patient Instructions (Signed)
Continue augmentin course. Add doxycycline 100mg  twice daily for 7 days. Continue ibuprofen 600mg  twice daily with meals.  Labs today. We will be in touch with results. Let us know right away if spreading redness or worsening instead of improvement noted.

## 2016-05-28 NOTE — Assessment & Plan Note (Signed)
Check TSH Consider switch from omeprazole to pantoprazole.

## 2016-05-29 LAB — CBC WITH DIFFERENTIAL/PLATELET
BASOS ABS: 0 10*3/uL (ref 0.0–0.1)
Basophils Relative: 0.2 % (ref 0.0–3.0)
EOS ABS: 0.1 10*3/uL (ref 0.0–0.7)
Eosinophils Relative: 2 % (ref 0.0–5.0)
HCT: 41.6 % (ref 39.0–52.0)
Hemoglobin: 13.9 g/dL (ref 13.0–17.0)
LYMPHS ABS: 1.4 10*3/uL (ref 0.7–4.0)
Lymphocytes Relative: 19.3 % (ref 12.0–46.0)
MCHC: 33.4 g/dL (ref 30.0–36.0)
MCV: 83.4 fl (ref 78.0–100.0)
MONO ABS: 0.7 10*3/uL (ref 0.1–1.0)
Monocytes Relative: 10 % (ref 3.0–12.0)
NEUTROS ABS: 5 10*3/uL (ref 1.4–7.7)
NEUTROS PCT: 68.5 % (ref 43.0–77.0)
PLATELETS: 233 10*3/uL (ref 150.0–400.0)
RBC: 4.99 Mil/uL (ref 4.22–5.81)
RDW: 13.9 % (ref 11.5–15.5)
WBC: 7.3 10*3/uL (ref 4.0–10.5)

## 2016-05-29 LAB — SEDIMENTATION RATE: Sed Rate: 19 mm/hr — ABNORMAL HIGH (ref 0–15)

## 2016-05-29 LAB — URIC ACID: URIC ACID, SERUM: 4.7 mg/dL (ref 4.0–7.8)

## 2016-05-29 LAB — TSH: TSH: 1.11 u[IU]/mL (ref 0.35–4.50)

## 2016-11-13 ENCOUNTER — Ambulatory Visit (INDEPENDENT_AMBULATORY_CARE_PROVIDER_SITE_OTHER): Payer: BLUE CROSS/BLUE SHIELD | Admitting: Primary Care

## 2016-11-13 ENCOUNTER — Encounter: Payer: Self-pay | Admitting: Primary Care

## 2016-11-13 VITALS — BP 124/78 | HR 100 | Temp 98.2°F | Ht 72.0 in | Wt 240.8 lb

## 2016-11-13 DIAGNOSIS — R040 Epistaxis: Secondary | ICD-10-CM

## 2016-11-13 DIAGNOSIS — R109 Unspecified abdominal pain: Secondary | ICD-10-CM | POA: Diagnosis not present

## 2016-11-13 LAB — POC URINALSYSI DIPSTICK (AUTOMATED)
Bilirubin, UA: NEGATIVE
Blood, UA: NEGATIVE
Glucose, UA: NEGATIVE
KETONES UA: NEGATIVE
LEUKOCYTES UA: NEGATIVE
Nitrite, UA: NEGATIVE
PH UA: 6
PROTEIN UA: NEGATIVE
UROBILINOGEN UA: NEGATIVE

## 2016-11-13 MED ORDER — MUPIROCIN 2 % EX OINT: 1.0000 "application " | TOPICAL_OINTMENT | Freq: Two times a day (BID) | CUTANEOUS | 0 refills | Status: DC

## 2016-11-13 NOTE — Progress Notes (Signed)
Pre visit review using our clinic review tool, if applicable. No additional management support is needed unless otherwise documented below in the visit note. 

## 2016-11-13 NOTE — Patient Instructions (Addendum)
Your nosebleeds are likely secondary to the dry air.  A humidifier will help to increase moisture in the air.  Apply the mupirocin ointment into the nose with a q-tip twice daily for the next 1 week.  Please call me if your bleeding persists.  It was a pleasure to see you today!

## 2016-11-13 NOTE — Progress Notes (Signed)
Subjective:    Patient ID: Jordan Shelton, male    DOB: 1976-05-29, 41 y.o.   MRN: [id number]  HPI  Jordan Shelton is a 41 year old male with a history of NAFL and GERD who presents today with multiple complaints.  1) Epistaxis: Dry nose throughout the winter, noticed mild bleeding when blowing nose, but able to control with pressure. Recent episode started to right nostril about 4-5 days ago with expulsion of a large blood clot and persistent bleeding. He applied pressure and cold compresses without much improvement. His bleeding lasted for about 30 minutes. The next morning he woke up and the bleeding started again and lasted for 15 minutes. Yesterday he noticed the bleeding after leaning forward to tie his shoes. He has used a nasal tampon OTC with no bleeding since yesterday. He denies use of antihistamines or nasal sprays.   2) Pelvic Pressure: Feels pressure deep inside the lower mid abdomen, around the bladder. He denies urinary frequency, foul smelling urine, hematuria, fevers, difficulty urinating. He feels a constant pressure. His last bowel movement was last night. He was constipated several days ago that lasted for 24 hours. He took a stool softener, drank prune juice, and used a suppository with relief.   Review of Systems  Constitutional: Negative for chills and fever.  HENT: Positive for nosebleeds. Negative for sore throat.   Respiratory: Negative for cough.   Gastrointestinal:       Lower mid abdominal pressure  Genitourinary: Negative for dysuria, flank pain, frequency and hematuria.  Neurological: Negative for weakness.       Past Medical History:  Diagnosis Date  . Dysplastic nevus of trunk 03/2014   mod melanocytic atypia, margins free  . GERD (gastroesophageal reflux disease)   . History of chickenpox      Social History   Social History  . Marital status: Single    Spouse name: N/A  . Number of children: N/A  . Years of education: N/A   Occupational History  .  Scientist, research (medical)     [employer]   Social History Main Topics  . Smoking status: Former Smoker    Quit date: 09/10/2003  . Smokeless tobacco: Never Used  . Alcohol use 0.0 oz/week     Comment: Social  . Drug use: No  . Sexual activity: Not on file   Other Topics Concern  . 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    Past Surgical History:  Procedure Laterality Date  . abd [place]  06/2011   fatty liver  . CYSTECTOMY     benign  . MOLE REMOVAL      Family History  Problem Relation Age of Onset  . Prostate cancer Father 33    non agressive  . Hypertension Father   . Crohn's disease Mother   . Lupus Mother   . Stroke Maternal Grandmother   . Heart disease Mother     pacemaker  . Thyroid disease Mother   . CAD Maternal Grandfather 40    MI    No Known Allergies  Current Outpatient Prescriptions on File Prior to Visit  Medication Sig Dispense Refill  . Coenzyme Q10 (CO Q 10 PO) Take 1 capsule by mouth.    Marland Kitchen MEGARED OMEGA-3  KRILL OIL 500 MG CAPS Take 500 mg by mouth daily.    . Multiple Vitamin (MULTIVITAMIN) tablet Take 1 tablet by mouth daily.    Marland Kitchen omeprazole (PRILOSEC) 40 MG capsule Take one capsule daily 90 capsule 3  . Potassium Gluconate 550 MG TABS Take 500 mg by mouth.     No current facility-administered medications on file prior to visit.     BP 124/78   Pulse 100   Temp 98.2 F (36.8 C) (Oral)   Ht 6' (1.829 m)   Wt 240 lb 12.8 oz (109.2 kg)   SpO2 97%   BMI 32.66 kg/m    Objective:   Physical Exam  Constitutional: He appears well-nourished.  HENT:  Right Ear: Tympanic membrane and ear canal normal.  Left Ear: Tympanic membrane and ear canal normal.  Nose: Mucosal edema present. Right sinus exhibits no maxillary sinus tenderness and no frontal  sinus tenderness. Left sinus exhibits no maxillary sinus tenderness and no frontal sinus tenderness.  Mouth/Throat: Oropharynx is clear and moist.  Scabbing noted to the medial inner right nares. No active bleeding.  Eyes: Conjunctivae are normal.  Neck: Neck supple.  Cardiovascular: Normal rate and regular rhythm.   Pulmonary/Chest: Effort normal and breath sounds normal. He has no wheezes. He has no rales.  Abdominal: Soft. Bowel sounds are normal. There is no tenderness. There is no CVA tenderness.  Skin: Skin is warm and dry.          Assessment & Plan:  Epistaxis:  Nosebleeds x several days, lasting 15-30 minutes. Overall improved and no nose bleeds since packing with OTC product yesterday. Exam today with scabbing as noted. Discussed to not pick the nose. Try air humidifier. Refrain from use of nasal steroids and antihistamines. Rx for mupirocin ointment sent to pharmacy. If no improvement, will send to ENT.  Abdominal Pressure:  Present to suprapubic region, deep. No other urinary symptoms. Recent constipation. Exam today unremarkable. Could be related to recent constipation. No alarm signs, does not appear acutely ill. UA: negative Return precautions provided.  Sheral Flow, NP

## 2016-11-13 NOTE — Addendum Note (Signed)
Addended by: Jacqualin Combes on: 11/13/2016 12:04 PM   Modules accepted: Orders

## 2017-03-26 ENCOUNTER — Encounter: Payer: Self-pay | Admitting: Family Medicine

## 2017-03-26 ENCOUNTER — Ambulatory Visit (INDEPENDENT_AMBULATORY_CARE_PROVIDER_SITE_OTHER): Payer: BLUE CROSS/BLUE SHIELD | Admitting: Family Medicine

## 2017-03-26 DIAGNOSIS — J029 Acute pharyngitis, unspecified: Secondary | ICD-10-CM

## 2017-03-26 DIAGNOSIS — M6281 Muscle weakness (generalized): Secondary | ICD-10-CM | POA: Insufficient documentation

## 2017-03-26 LAB — POCT RAPID STREP A (OFFICE): RAPID STREP A SCREEN: POSITIVE — AB

## 2017-03-26 MED ORDER — PENICILLIN V POTASSIUM 500 MG PO TABS: 500.0000 mg | ORAL_TABLET | Freq: Three times a day (TID) | ORAL | 0 refills | Status: DC

## 2017-03-26 NOTE — Progress Notes (Signed)
Subjective:   Patient ID: Jordan Shelton, male    DOB: 1976/07/29, 41 y.o.   MRN: [id number]  Jordan Shelton is a pleasant 41 y.o. year old male who presents to clinic today with Sore Throat (left ) and muscle weakness  on 03/26/2017  HPI:  Sore throat and muscle weakness-  Past few days, "awareness of his throat." Kids have been sick.  Has noticed muscles are sore. Feels his upper body strength has decreased.  No HA No tick bites No rashes No fevers or chills No n/v/d  Current Outpatient Prescriptions on File Prior to Visit  Medication Sig Dispense Refill  . Coenzyme Q10 (CO Q 10 PO) Take 1 capsule by mouth.    Marland Kitchen MEGARED OMEGA-3 KRILL OIL 500 MG CAPS Take 500 mg by mouth daily.    . Multiple Vitamin (MULTIVITAMIN) tablet Take 1 tablet by mouth daily.    . mupirocin ointment (BACTROBAN) 2 % Place 1 application into the nose 2 (two) times daily. 22 g 0  . omeprazole (PRILOSEC) 40 MG capsule Take one capsule daily 90 capsule 3  . Potassium Gluconate 550 MG TABS Take 500 mg by mouth.     No current facility-administered medications on file prior to visit.     No Known Allergies  Past Medical History:  Diagnosis Date  . Dysplastic nevus of trunk 03/2014   mod melanocytic atypia, margins free  . GERD (gastroesophageal reflux disease)   . History of chickenpox     Past Surgical History:  Procedure Laterality Date  . abd [place]  06/2011   fatty liver  . CYSTECTOMY     benign  . MOLE REMOVAL      Family History  Problem Relation Age of Onset  . Prostate cancer Father 19       non agressive  . Hypertension Father   . Crohn's disease Mother   . Lupus Mother   . Stroke Maternal Grandmother   . Heart disease Mother        pacemaker  . Thyroid disease Mother   . CAD Maternal Grandfather 20       MI    Social History   Social History  . Marital status: Single    Spouse name: N/A  . Number of children: N/A  . Years of education: N/A   Occupational History  . Proofreader     [employer]   Social History Main Topics  . Smoking status: Former Smoker    Quit date: 09/10/2003  . Smokeless tobacco: Never Used  . Alcohol use 0.0 oz/week     Comment: Social  . Drug use: No  . Sexual activity: Not on file   Other Topics Concern  . 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     Review of Systems  Constitutional: Negative.   HENT: Positive for sore throat. Negative for congestion, dental problem, drooling, ear discharge, ear pain, facial swelling, hearing loss, mouth sores, nosebleeds, postnasal drip, rhinorrhea, sinus pain, sinus pressure, sneezing, tinnitus, trouble swallowing and voice change.   Respiratory: Negative.   Gastrointestinal: Negative.   Musculoskeletal: Positive for myalgias.  Neurological: Negative.   Hematological: Negative.   Psychiatric/Behavioral: Negative.   All other systems reviewed and are negative.  Objective:    BP 120/90   Pulse 95   Temp 98.3 F (36.8 C)   Ht 6' (1.829 m)   Wt 234 lb (106.1 kg)   SpO2 98%   BMI 31.74 kg/m    Physical Exam  Constitutional: He is oriented to person, place, and time. He appears well-developed and well-nourished. No distress.  HENT:  Head: Normocephalic and atraumatic.  Mouth/Throat: Uvula is midline. Posterior oropharyngeal erythema present. No oropharyngeal exudate.  Eyes: Conjunctivae are normal.  Neck: Normal range of motion.  Cardiovascular: Normal rate.   Pulmonary/Chest: Effort normal.  Lymphadenopathy:    He has no cervical adenopathy.  Neurological: He is alert and oriented to person, place, and time.  Decreased grip strength bilaterally  Skin: Skin is warm and dry. He is not diaphoretic.  Psychiatric: He has a normal mood and affect. His behavior is normal.  Judgment and thought content normal.  Nursing note and vitals reviewed.         Assessment & Plan:   Sore throat - Plan: POCT rapid strep A  Muscle weakness  Sore throat - Plan: POCT rapid strep A No Follow-up on file.

## 2017-03-26 NOTE — Patient Instructions (Signed)
Strep Throat Strep throat is a bacterial infection of the throat. Your health care provider may call the infection tonsillitis or pharyngitis, depending on whether there is swelling in the tonsils or at the back of the throat. Strep throat is most common during the cold months of the year in children who are 5-39 years of age, but it can happen during any season in people of any age. This infection is spread from person to person (contagious) through coughing, sneezing, or close contact. What are the causes? Strep throat is caused by the bacteria called Streptococcus pyogenes. What increases the risk? This condition is more likely to develop in:  People who spend time in crowded places where the infection can spread easily.  People who have close contact with someone who has strep throat.  What are the signs or symptoms? Symptoms of this condition include:  Fever or chills.  Redness, swelling, or pain in the tonsils or throat.  Pain or difficulty when swallowing.  White or yellow spots on the tonsils or throat.  Swollen, tender glands in the neck or under the jaw.  Red rash all over the body (rare).  How is this diagnosed? This condition is diagnosed by performing a rapid strep test or by taking a swab of your throat (throat culture test). Results from a rapid strep test are usually ready in a few minutes, but throat culture test results are available after one or two days. How is this treated? This condition is treated with antibiotic medicine. Follow these instructions at home: Medicines  Take over-the-counter and prescription medicines only as told by your health care provider.  Take your antibiotic as told by your health care provider. Do not stop taking the antibiotic even if you start to feel better.  Have family members who also have a sore throat or fever tested for strep throat. They may need antibiotics if they have the strep infection. Eating and drinking  Do not  share food, drinking cups, or personal items that could cause the infection to spread to other people.  If swallowing is difficult, try eating soft foods until your sore throat feels better.  Drink enough fluid to keep your urine clear or pale yellow. General instructions  Gargle with a salt-water mixture 3-4 times per day or as needed. To make a salt-water mixture, completely dissolve -1 tsp of salt in 1 cup of warm water.  Make sure that all household members wash their hands well.  Get plenty of rest.  Stay home from school or work until you have been taking antibiotics for 24 hours.  Keep all follow-up visits as told by your health care provider. This is important. Contact a health care provider if:  The glands in your neck continue to get bigger.  You develop a rash, cough, or earache.  You cough up a thick liquid that is green, yellow-brown, or bloody.  You have pain or discomfort that does not get better with medicine.  Your problems seem to be getting worse rather than better.  You have a fever. Get help right away if:  You have new symptoms, such as vomiting, severe headache, stiff or painful neck, chest pain, or shortness of breath.  You have severe throat pain, drooling, or changes in your voice.  You have swelling of the neck, or the skin on the neck becomes red and tender.  You have signs of dehydration, such as fatigue, dry mouth, and decreased urination.  You become increasingly sleepy, or   you cannot wake up completely.  Your joints become red or painful. This information is not intended to replace advice given to you by your health care provider. Make sure you discuss any questions you have with your health care provider. Document Released: 08/23/2000 Document Revised: 04/24/2016 Document Reviewed: 12/19/2014 Elsevier Interactive Patient Education  2017 [employer] Inc[employer]  

## 2017-03-26 NOTE — Assessment & Plan Note (Signed)
New- rapid strep pos- treat with PCN three times daily x 10 days. See AVS for supportive care instructions.

## 2017-03-26 NOTE — Assessment & Plan Note (Signed)
Decreased grip strength on exam. ? Due to strep illness but advised pt this needs to be followed up. Neuro referral if this persists. The patient indicates understanding of these issues and agrees with the plan.

## 2017-12-03 ENCOUNTER — Encounter: Payer: Self-pay | Admitting: Family Medicine

## 2017-12-03 ENCOUNTER — Ambulatory Visit: Payer: Managed Care, Other (non HMO) | Admitting: Family Medicine

## 2017-12-03 VITALS — BP 120/80 | HR 81 | Temp 98.1°F | Wt 229.0 lb

## 2017-12-03 DIAGNOSIS — R059 Cough, unspecified: Secondary | ICD-10-CM | POA: Insufficient documentation

## 2017-12-03 DIAGNOSIS — R05 Cough: Secondary | ICD-10-CM

## 2017-12-03 MED ORDER — BENZONATATE 100 MG PO CAPS: 100.0000 mg | ORAL_CAPSULE | Freq: Three times a day (TID) | ORAL | 0 refills | Status: DC | PRN

## 2017-12-03 MED ORDER — PREDNISONE 20 MG PO TABS: ORAL_TABLET | ORAL | 0 refills | Status: DC

## 2017-12-03 NOTE — Patient Instructions (Addendum)
You likely have persistent post viral cough after recent respiratory infection. Treat with prednisone course for bronchial inflammation. Take tessalon perls as needed for cough.  Push fluids and plenty of rest. Update [place] if not improving with this, or if persistent cough despite treatment.  Good to see you today. I hope you start feeling better soon.

## 2017-12-03 NOTE — Assessment & Plan Note (Signed)
Anticipate post-infectious cough after initial respiratory infection. Supportive care reviewed. I don't see signs of bacterial infection at this time. Will Rx prednisone taper, tessalon perls. Update if not improving with treatment. Pt agrees with plan.

## 2017-12-03 NOTE — Progress Notes (Signed)
BP 120/80 (BP Location: Left Arm, Patient Position: Sitting, Cuff Size: Normal)   Pulse 81   Temp 98.1 F (36.7 C) (Oral)   Wt 229 lb (103.9 kg)   SpO2 98%   BMI 31.06 kg/m    CC: URI Subjective:    Patient ID: Jordan Shelton, male    DOB: 1976-01-18, 42 y.o.   MRN: [id number]  HPI: Jordan Shelton is a 42 y.o. male presenting on 12/03/2017 for Cough (Productive cough- especially in the morining for about 2 mos. Has improved at times but worsened lately. Wife was just dx with bronchitis, suggested pt be checked. )   2 mo h/o intermittent productive cough, worse in the mornings. Started with more congestion, URI sxs - those symptoms have largely resolved. Now with persistent productive cough and intermittent sinus pressure. He does endorse some coughing fits. Chest > head congestion, has improved after pseudophed yesterday.   No fevers/chills, ear pain, dyspnea or wheezing.   Treating with delsym cough syrup, ricola cough drops.  Denies significant GERD sxs - rare PPI use. Raw apple cider vinegar helps a lot.  No h/o asthma.  Ex smoker. Wife recently diagnosed with bronchitis.   Relevant past medical, surgical, family and social history reviewed and updated as indicated. Interim medical history since our last visit reviewed. Allergies and medications reviewed and updated. Outpatient Medications Prior to Visit  Medication Sig Dispense Refill  . Coenzyme Q10 (CO Q 10 PO) Take 1 capsule by mouth.    Marland Kitchen MEGARED OMEGA-3 KRILL OIL 500 MG CAPS Take 500 mg by mouth daily.    . Multiple Vitamin (MULTIVITAMIN) tablet Take 1 tablet by mouth daily.    Marland Kitchen omeprazole (PRILOSEC) 40 MG capsule Take one capsule daily (Patient taking differently: Take 40 mg by mouth daily. As needed) 90 capsule 3  . Potassium Gluconate 550 MG TABS Take 500 mg by mouth.    . mupirocin ointment (BACTROBAN) 2 % Place 1 application into the nose 2 (two) times daily. 22 g 0  . penicillin v potassium (VEETID) 500 MG tablet Take 1  tablet (500 mg total) by mouth 3 (three) times daily. 30 tablet 0   No facility-administered medications prior to visit.      Per HPI unless specifically indicated in ROS section below Review of Systems     Objective:    BP 120/80 (BP Location: Left Arm, Patient Position: Sitting, Cuff Size: Normal)   Pulse 81   Temp 98.1 F (36.7 C) (Oral)   Wt 229 lb (103.9 kg)   SpO2 98%   BMI 31.06 kg/m   Wt Readings from Last 3 Encounters:  12/03/17 229 lb (103.9 kg)  03/26/17 234 lb (106.1 kg)  11/13/16 240 lb 12.8 oz (109.2 kg)    Physical Exam  Constitutional: He appears well-developed and well-nourished. No distress.  HENT:  Head: Normocephalic and atraumatic.  Right Ear: Hearing, tympanic membrane, external ear and ear canal normal.  Left Ear: Hearing, tympanic membrane, external ear and ear canal normal.  Nose: Mucosal edema (and erythema) present. No rhinorrhea. Right sinus exhibits no maxillary sinus tenderness and no frontal sinus tenderness. Left sinus exhibits no maxillary sinus tenderness and no frontal sinus tenderness.  Mouth/Throat: Uvula is midline, oropharynx is clear and moist and mucous membranes are normal. No oropharyngeal exudate, posterior oropharyngeal edema, posterior oropharyngeal erythema or tonsillar abscesses.  Eyes: Pupils are equal, round, and reactive to light. Conjunctivae and EOM are normal. No scleral icterus.  Neck: Normal range  of motion. Neck supple.  Cardiovascular: Normal rate, regular rhythm, normal heart sounds and intact distal pulses.  No murmur heard. Pulmonary/Chest: Effort normal and breath sounds normal. No respiratory distress. He has no wheezes. He has no rales.  Lungs clear  Lymphadenopathy:    He has no cervical adenopathy.  Skin: Skin is warm and dry. No rash noted.  Nursing note and vitals reviewed.     Assessment & Plan:   Problem List Items Addressed This Visit    Cough - Primary    Anticipate post-infectious cough after  initial respiratory infection. Supportive care reviewed. I don't see signs of bacterial infection at this time. Will Rx prednisone taper, tessalon perls. Update if not improving with treatment. Pt agrees with plan.           Meds ordered this encounter  Medications  . predniSONE (DELTASONE) 20 MG tablet    Sig: Take two tablets daily for 3 days followed by one tablet daily for 4 days    Dispense:  10 tablet    Refill:  0  . benzonatate (TESSALON) 100 MG capsule    Sig: Take 1 capsule (100 mg total) by mouth 3 (three) times daily as needed for cough.    Dispense:  30 capsule    Refill:  0   No orders of the defined types were placed in this encounter.   Follow up plan: Return if symptoms worsen or fail to improve.  Ria Bush, MD

## 2018-07-28 ENCOUNTER — Ambulatory Visit: Payer: Managed Care, Other (non HMO) | Admitting: Family Medicine

## 2018-07-28 ENCOUNTER — Encounter: Payer: Self-pay | Admitting: Family Medicine

## 2018-07-28 VITALS — BP 120/82 | HR 90 | Temp 98.2°F | Ht 72.0 in | Wt 231.5 lb

## 2018-07-28 DIAGNOSIS — S29012A Strain of muscle and tendon of back wall of thorax, initial encounter: Secondary | ICD-10-CM | POA: Diagnosis not present

## 2018-07-28 DIAGNOSIS — Z23 Encounter for immunization: Secondary | ICD-10-CM | POA: Diagnosis not present

## 2018-07-28 LAB — POC URINALSYSI DIPSTICK (AUTOMATED)
BILIRUBIN UA: NEGATIVE
Glucose, UA: NEGATIVE
KETONES UA: NEGATIVE
Leukocytes, UA: NEGATIVE
Nitrite, UA: NEGATIVE
PH UA: 5 (ref 5.0–8.0)
Protein, UA: POSITIVE — AB
RBC UA: NEGATIVE
Urobilinogen, UA: 0.2 E.U./dL

## 2018-07-28 MED ORDER — METHOCARBAMOL 500 MG PO TABS: 500.0000 mg | ORAL_TABLET | Freq: Three times a day (TID) | ORAL | 0 refills | Status: DC | PRN

## 2018-07-28 NOTE — Patient Instructions (Addendum)
Flu shot today I think you you have left sided rhomboid strain Do exercises provided today.  May use ibuprofen and heating pad, massage to area.  May try muscle relaxant as needed.  This should get better with above and time

## 2018-07-28 NOTE — Progress Notes (Signed)
BP 120/82 (BP Location: Left Arm, Patient Position: Sitting, Cuff Size: Large)   Pulse 90   Temp 98.2 F (36.8 C) (Oral)   Ht 6' (1.829 m)   Wt 231 lb 8 oz (105 kg)   SpO2 98%   BMI 31.40 kg/m    CC: back pain Subjective:    Patient ID: Jordan Shelton, male    DOB: [birth year], 42 y.o.   MRN: [id number]  HPI: Jordan Shelton is a 42 y.o. male presenting on 07/28/2018 for Back Pain (C/o upper/mid back pain on left side. Pain is off and on. Started 3 wks. Thinks it may be a pinched nerve. )   3-4 wk h/o L mid/upper back that does improve with certain stretching exercises. Describes sharp stabbing pain and pressure. Has tried ibuprofen and tylenol for this with temporary benefit. Pain stays at 1-2/10 intensity, 4-5/10 at its worse. Denies inciting trauma/injury. Points to L mid back.   He goes to [place] regularly.   Off and on back pain for years in same area as current symptoms.   Relevant past medical, surgical, family and social history reviewed and updated as indicated. Interim medical history since our last visit reviewed. Allergies and medications reviewed and updated. Outpatient Medications Prior to Visit  Medication Sig Dispense Refill  . Coenzyme Q10 (CO Q 10 PO) Take 1 capsule by mouth.    Marland Kitchen glycopyrrolate (ROBINUL) 1 MG tablet Take 2 tablets by mouth 2 (two) times daily.    Marland Kitchen MEGARED OMEGA-3 KRILL OIL 500 MG CAPS Take 500 mg by mouth daily.    . Multiple Vitamin (MULTIVITAMIN) tablet Take 1 tablet by mouth daily.    Marland Kitchen omeprazole (PRILOSEC) 40 MG capsule Take one capsule daily (Patient taking differently: Take 40 mg by mouth daily. As needed) 90 capsule 3  . Potassium Gluconate 550 MG TABS Take 500 mg by mouth.    . benzonatate (TESSALON) 100 MG capsule Take 1 capsule (100 mg total) by mouth 3 (three) times daily as needed for cough. 30 capsule 0  . predniSONE (DELTASONE) 20 MG tablet Take two tablets daily for 3 days followed by one tablet daily for 4 days 10 tablet 0   No  facility-administered medications prior to visit.      Per HPI unless specifically indicated in ROS section below Review of Systems     Objective:    BP 120/82 (BP Location: Left Arm, Patient Position: Sitting, Cuff Size: Large)   Pulse 90   Temp 98.2 F (36.8 C) (Oral)   Ht 6' (1.829 m)   Wt 231 lb 8 oz (105 kg)   SpO2 98%   BMI 31.40 kg/m   Wt Readings from Last 3 Encounters:  07/28/18 231 lb 8 oz (105 kg)  12/03/17 229 lb (103.9 kg)  03/26/17 234 lb (106.1 kg)    Physical Exam  Constitutional: He appears well-developed and well-nourished. No distress.  Cardiovascular: Normal rate, regular rhythm and normal heart sounds.  No murmur heard. Pulmonary/Chest: Effort normal and breath sounds normal. No respiratory distress. He has no wheezes. He has no rales.  Musculoskeletal: Normal range of motion.  FROM at shoulders Reproducible tenderness to palpation L rhomboids medial and inferior to scapula  Nursing note and vitals reviewed.  Results for orders placed or performed in visit on 07/28/18  POCT Urinalysis Dipstick (Automated)  Result Value Ref Range   Color, UA yellow    Clarity, UA clear    Glucose, UA Negative Negative  Bilirubin, UA negative    Ketones, UA negative    Spec Grav, UA >=1.030 (A) 1.010 - 1.025   Blood, UA negative    pH, UA 5.0 5.0 - 8.0   Protein, UA Positive (A) Negative   Urobilinogen, UA 0.2 0.2 or 1.0 E.U./dL   Nitrite, UA negative    Leukocytes, UA Negative Negative      Assessment & Plan:   Problem List Items Addressed This Visit    Strain of rhomboid muscle - Primary    Anticipate L sided rhomboid strain - supportive care reviewed including NSAIDs, heating pad, massage, exercises from SM pt advisor provided today. Update if not improving with treatment. Pt agrees with plan.       Relevant Orders   POCT Urinalysis Dipstick (Automated) (Completed)    Other Visit Diagnoses    Need for influenza vaccination       Relevant Orders    Flu Vaccine QUAD 36+ mos IM (Completed)       Meds ordered this encounter  Medications  . methocarbamol (ROBAXIN) 500 MG tablet    Sig: Take 1 tablet (500 mg total) by mouth 3 (three) times daily as needed for muscle spasms (sedation precautions).    Dispense:  30 tablet    Refill:  0   Orders Placed This Encounter  Procedures  . Flu Vaccine QUAD 36+ mos IM  . POCT Urinalysis Dipstick (Automated)    Follow up plan: Return if symptoms worsen or fail to improve.  Ria Bush, MD

## 2018-07-28 NOTE — Assessment & Plan Note (Signed)
Anticipate L sided rhomboid strain - supportive care reviewed including NSAIDs, heating pad, massage, exercises from [facility] pt advisor provided today. Update if not improving with treatment. Pt agrees with plan.

## 2018-09-14 ENCOUNTER — Ambulatory Visit: Payer: Managed Care, Other (non HMO) | Admitting: Family Medicine

## 2018-09-14 ENCOUNTER — Encounter: Payer: Self-pay | Admitting: Family Medicine

## 2018-09-14 VITALS — BP 120/70 | HR 84 | Temp 98.4°F | Ht 72.0 in | Wt 235.0 lb

## 2018-09-14 DIAGNOSIS — R809 Proteinuria, unspecified: Secondary | ICD-10-CM | POA: Diagnosis not present

## 2018-09-14 DIAGNOSIS — J019 Acute sinusitis, unspecified: Secondary | ICD-10-CM

## 2018-09-14 MED ORDER — PREDNISONE 20 MG PO TABS: ORAL_TABLET | ORAL | 0 refills | Status: DC

## 2018-09-14 MED ORDER — GUAIFENESIN-CODEINE 100-10 MG/5ML PO SYRP: 5.0000 mL | ORAL_SOLUTION | Freq: Two times a day (BID) | ORAL | 0 refills | Status: DC | PRN

## 2018-09-14 MED ORDER — BENZONATATE 100 MG PO CAPS: 100.0000 mg | ORAL_CAPSULE | Freq: Three times a day (TID) | ORAL | 0 refills | Status: DC | PRN

## 2018-09-14 NOTE — Assessment & Plan Note (Signed)
Rpt UA next physical.

## 2018-09-14 NOTE — Assessment & Plan Note (Signed)
Anticipate viral given improving with supportive care. Pt's main concern is persistent cough. Will Rx cheratussin for night time, tessalon perls during day, prednisone taper for inflammation. Pt agrees with plan.

## 2018-09-14 NOTE — Patient Instructions (Signed)
You have a sinus infection. Take medicine as prescribed: prednisone taper, codeine cough syrup for night time, and tessalon perls during the day.  Push fluids and plenty of rest. Nasal saline irrigation or neti pot to help drain sinuses. May use mucinex D with plenty of fluid to help mobilize mucous. Please let us know if fever >101.5, trouble opening/closing mouth, difficulty swallowing, or worsening instead of improving as expected.

## 2018-09-14 NOTE — Progress Notes (Signed)
BP 120/70 (BP Location: Left Arm, Patient Position: Sitting, Cuff Size: Large)   Pulse 84   Temp 98.4 F (36.9 C) (Oral)   Ht 6' (1.829 m)   Wt 235 lb (106.6 kg)   SpO2 97%   BMI 31.87 kg/m    CC: sinusitis Subjective:    Patient ID: Jordan Shelton, male    DOB: 1976/06/09, 43 y.o.   MRN: [id number]  HPI: Niccolas Loeper is a 43 y.o. male presenting on 09/14/2018 for Sinus Problem (C/o dry cough, sinus pressure and drainage. Sxs started about 1 wk ago. All sxs have improved except cough. Tried Ricola, Delsym and Mucinex D. )   1 wk h/o sinus congestion, PNdrainage, cough. Mainly concerned about ongoing cough affecting sleep. Tooth pain over last few days. Congestion and other symptoms are improving with mucinex D.   No fevers/chills, ear pain, ST, dyspnea or wheezing. Wife recently sick as well - with bronchitis.   Ex smoker.      Relevant past medical, surgical, family and social history reviewed and updated as indicated. Interim medical history since our last visit reviewed. Allergies and medications reviewed and updated. Outpatient Medications Prior to Visit  Medication Sig Dispense Refill  . Coenzyme Q10 (CO Q 10 PO) Take 1 capsule by mouth.    Marland Kitchen glycopyrrolate (ROBINUL) 1 MG tablet Take 2 tablets by mouth 2 (two) times daily.    Marland Kitchen MEGARED OMEGA-3 KRILL OIL 500 MG CAPS Take 500 mg by mouth daily.    . Multiple Vitamin (MULTIVITAMIN) tablet Take 1 tablet by mouth daily.    Marland Kitchen omeprazole (PRILOSEC) 40 MG capsule Take one capsule daily (Patient taking differently: Take 40 mg by mouth daily. As needed) 90 capsule 3  . Potassium Gluconate 550 MG TABS Take 500 mg by mouth.    . methocarbamol (ROBAXIN) 500 MG tablet Take 1 tablet (500 mg total) by mouth 3 (three) times daily as needed for muscle spasms (sedation precautions). 30 tablet 0   No facility-administered medications prior to visit.      Per HPI unless specifically indicated in ROS section below Review of Systems Objective:    BP 120/70 (BP Location: Left Arm, Patient Position: Sitting, Cuff Size: Large)   Pulse 84   Temp 98.4 F (36.9 C) (Oral)   Ht 6' (1.829 m)   Wt 235 lb (106.6 kg)   SpO2 97%   BMI 31.87 kg/m   Wt Readings from Last 3 Encounters:  09/14/18 235 lb (106.6 kg)  07/28/18 231 lb 8 oz (105 kg)  12/03/17 229 lb (103.9 kg)    Physical Exam Vitals signs and nursing note reviewed.  Constitutional:      General: He is not in acute distress.    Appearance: He is well-developed.  HENT:     Head: Normocephalic and atraumatic.     Right Ear: Hearing, tympanic membrane, ear canal and external ear normal.     Left Ear: Hearing, tympanic membrane, ear canal and external ear normal.     Nose: Mucosal edema (R>L nasal and sinus congestion with discharge present) present. No rhinorrhea.     Right Sinus: No maxillary sinus tenderness or frontal sinus tenderness.     Left Sinus: No maxillary sinus tenderness or frontal sinus tenderness.     Mouth/Throat:     Pharynx: Uvula midline. No oropharyngeal exudate or posterior oropharyngeal erythema.     Tonsils: No tonsillar abscesses.  Eyes:     General: No scleral icterus.  Conjunctiva/sclera: Conjunctivae normal.     Pupils: Pupils are equal, round, and reactive to light.  Neck:     Musculoskeletal: Normal range of motion and neck supple.  Cardiovascular:     Rate and Rhythm: Normal rate and regular rhythm.     Heart sounds: Normal heart sounds. No murmur.  Pulmonary:     Effort: Pulmonary effort is normal. No respiratory distress.     Breath sounds: Normal breath sounds. No wheezing or rales.  Lymphadenopathy:     Cervical: No cervical adenopathy.  Skin:    General: Skin is warm and dry.     Findings: No rash.       Results for orders placed or performed in visit on 07/28/18  POCT Urinalysis Dipstick (Automated)  Result Value Ref Range   Color, UA yellow    Clarity, UA clear    Glucose, UA Negative Negative   Bilirubin, UA negative     Ketones, UA negative    Spec Grav, UA >=1.030 (A) 1.010 - 1.025   Blood, UA negative    pH, UA 5.0 5.0 - 8.0   Protein, UA Positive (A) Negative   Urobilinogen, UA 0.2 0.2 or 1.0 E.U./dL   Nitrite, UA negative    Leukocytes, UA Negative Negative   Assessment & Plan:   Problem List Items Addressed This Visit    Proteinuria    Rpt UA next physical.       Acute non-recurrent sinusitis - Primary    Anticipate viral given improving with supportive care. Pt's main concern is persistent cough. Will Rx cheratussin for night time, tessalon perls during day, prednisone taper for inflammation. Pt agrees with plan.       Relevant Medications   predniSONE (DELTASONE) 20 MG tablet   guaiFENesin-codeine (CHERATUSSIN AC) 100-10 MG/5ML syrup   benzonatate (TESSALON) 100 MG capsule       Meds ordered this encounter  Medications  . predniSONE (DELTASONE) 20 MG tablet    Sig: Take two tablets daily for 3 days followed by one tablet daily for 4 days    Dispense:  10 tablet    Refill:  0  . guaiFENesin-codeine (CHERATUSSIN AC) 100-10 MG/5ML syrup    Sig: Take 5 mLs by mouth 2 (two) times daily as needed.    Dispense:  120 mL    Refill:  0  . benzonatate (TESSALON) 100 MG capsule    Sig: Take 1 capsule (100 mg total) by mouth 3 (three) times daily as needed for cough.    Dispense:  30 capsule    Refill:  0   No orders of the defined types were placed in this encounter.   Follow up plan: Return if symptoms worsen or fail to improve.  Ria Bush, MD

## 2018-10-01 ENCOUNTER — Ambulatory Visit (INDEPENDENT_AMBULATORY_CARE_PROVIDER_SITE_OTHER): Payer: Managed Care, Other (non HMO) | Admitting: Internal Medicine

## 2018-10-01 ENCOUNTER — Encounter: Payer: Self-pay | Admitting: Internal Medicine

## 2018-10-01 VITALS — BP 124/82 | HR 90 | Temp 99.1°F | Wt 232.0 lb

## 2018-10-01 DIAGNOSIS — J069 Acute upper respiratory infection, unspecified: Secondary | ICD-10-CM | POA: Diagnosis not present

## 2018-10-01 DIAGNOSIS — B9789 Other viral agents as the cause of diseases classified elsewhere: Secondary | ICD-10-CM

## 2018-10-01 MED ORDER — BENZONATATE 100 MG PO CAPS: 100.0000 mg | ORAL_CAPSULE | Freq: Three times a day (TID) | ORAL | 0 refills | Status: DC | PRN

## 2018-10-01 MED ORDER — GUAIFENESIN-CODEINE 100-10 MG/5ML PO SYRP: 5.0000 mL | ORAL_SOLUTION | Freq: Two times a day (BID) | ORAL | 0 refills | Status: DC | PRN

## 2018-10-01 MED ORDER — AZITHROMYCIN 250 MG PO TABS: ORAL_TABLET | ORAL | 0 refills | Status: DC

## 2018-10-01 NOTE — Progress Notes (Signed)
Subjective:    Patient ID: Jordan Shelton, male    DOB: [birth year], 43 y.o.   MRN: [id number]  HPI  Pt presents to the clinic today with c/o right ear pressure, cough and chest congestion. This started 3 weeks ago. He denies ear pain or decreased hearing. The cough is now productive of green mucous. He denies headache, runny nose, nasal congestion or shortness of breath. He denies fever, chills or body aches. He was seen 1/6 for the same, diagnosed with viral sinusitis. He was treated with Prednisone, Cheritussin AC and Tessalon. He reports some improvement but the cough persists, and now his mucous is dark. He has not tried anything OTC for this. He is concerned because he is leaving for [place] tomorrow and doesn't want to be sick when he is out of town.  Review of Systems      Past Medical History:  Diagnosis Date  . Dysplastic nevus of trunk 03/2014   mod melanocytic atypia, margins free  . GERD (gastroesophageal reflux disease)   . History of chickenpox     Current Outpatient Medications  Medication Sig Dispense Refill  . benzonatate (TESSALON) 100 MG capsule Take 1 capsule (100 mg total) by mouth 3 (three) times daily as needed for cough. 30 capsule 0  . Coenzyme Q10 (CO Q 10 PO) Take 1 capsule by mouth.    Marland Kitchen glycopyrrolate (ROBINUL) 1 MG tablet Take 2 tablets by mouth 2 (two) times daily.    Marland Kitchen guaiFENesin-codeine (CHERATUSSIN AC) 100-10 MG/5ML syrup Take 5 mLs by mouth 2 (two) times daily as needed. 120 mL 0  . MEGARED OMEGA-3 KRILL OIL 500 MG CAPS Take 500 mg by mouth daily.    . Multiple Vitamin (MULTIVITAMIN) tablet Take 1 tablet by mouth daily.    Marland Kitchen omeprazole (PRILOSEC) 40 MG capsule Take one capsule daily (Patient taking differently: Take 40 mg by mouth daily. As needed) 90 capsule 3  . Potassium Gluconate 550 MG TABS Take 500 mg by mouth.     No current facility-administered medications for this visit.     No Known Allergies  Family History  Problem Relation Age of  Onset  . Prostate cancer Father 50       non agressive  . Hypertension Father   . Crohn's disease Mother   . Lupus Mother   . Stroke Maternal Grandmother   . Heart disease Mother        pacemaker  . Thyroid disease Mother   . CAD Maternal Grandfather 48       MI    Social History   Socioeconomic History  . Marital status: Single    Spouse name: Not on file  . Number of children: Not on file  . Years of education: Not on file  . Highest education level: Not on file  Occupational History  . Occupation: Scientist, research (medical)    Comment: [employer]  . Financial resource strain: Not on file  . Food insecurity:    Worry: Not on file    Inability: Not on file  . Transportation needs:    Medical: Not on file    Non-medical: Not on file  Tobacco Use  . Smoking status: Former Smoker    Last attempt to quit: 09/10/2003    Years since quitting: 15.0  . Smokeless tobacco: Never Used  Substance and Sexual Activity  . Alcohol use: Yes    Alcohol/week: 0.0 standard drinks    Comment: Social  . Drug  use: No  . Sexual activity: Not on file  Lifestyle  . Physical activity:    Days per week: Not on file    Minutes per session: Not on file  . Stress: Not on file  Relationships  . Social connections:    Talks on phone: Not on file    Gets together: Not on file    Attends religious service: Not on file    Active member of club or organization: Not on file    Attends meetings of clubs or organizations: Not on file    Relationship status: Not on file  . Intimate partner violence:    Fear of current or ex partner: Not on file    Emotionally abused: Not on file    Physically abused: Not on file    Forced sexual activity: Not on file  Other Topics Concern  . Not on file  Social History Narrative   Lives with wife and 2 daughters   Occupation: working at [employer]: finished college   Activity: joined gym this month    Diet: juicing more, good water,  fruits/vegetables daily.     Constitutional: Denies fever, malaise, fatigue, headache or abrupt weight changes.  HEENT: Pt reports ear pressure. Denies eye pain, eye redness, ear pain, ringing in the ears, wax buildup, runny nose, nasal congestion, bloody nose, or sore throat. Respiratory: Pt reports cough. Denies difficulty breathing, shortness of breath.   Cardiovascular: Denies chest pain, chest tightness, palpitations or swelling in the hands or feet.   No other specific complaints in a complete review of systems (except as listed in HPI above).  Objective:   Physical Exam   BP 124/82   Pulse 90   Temp 99.1 F (37.3 C) (Oral)   Wt 232 lb (105.2 kg)   SpO2 98%   BMI 31.46 kg/m  Wt Readings from Last 3 Encounters:  10/01/18 232 lb (105.2 kg)  09/14/18 235 lb (106.6 kg)  07/28/18 231 lb 8 oz (105 kg)    General: Appears his stated age, well developed, well nourished in NAD. HEENT: Head: normal shape and size, no sinus tenderness noted; Ears: Tm's gray and intact, normal light reflex; Nose: mucosa pink and moist, septum midline; Throat/Mouth: Teeth present, mucosa erythmatous and moist, + PND< no exudate, lesions or ulcerations noted.  Neck:  No adenopathy noted. Cardiovascular: Normal rate and rhythm. S1,S2 noted.  No murmur, rubs or gallops noted.  Pulmonary/Chest: Normal effort and positive vesicular breath sounds. No respiratory distress. No wheezes, rales or ronchi noted.    BMET    Component Value Date/Time   NA 138 09/28/2015 0758   K 4.2 09/28/2015 0758   CL 103 09/28/2015 0758   CO2 28 09/28/2015 0758   GLUCOSE 110 (H) 09/28/2015 0758   BUN 13 09/28/2015 0758   CREATININE 1.01 09/28/2015 0758   CALCIUM 9.6 09/28/2015 0758    Lipid Panel     Component Value Date/Time   CHOL 250 (H) 09/28/2015 0758   TRIG 291.0 (H) 09/28/2015 0758   HDL 43.40 09/28/2015 0758   CHOLHDL 6 09/28/2015 0758   VLDL 58.2 (H) 09/28/2015 0758   LDLCALC 165 (H) 11/12/2013 0927     CBC    Component Value Date/Time   WBC 7.3 05/28/2016 1715   RBC 4.99 05/28/2016 1715   HGB 13.9 05/28/2016 1715   HCT 41.6 05/28/2016 1715   PLT 233.0 05/28/2016 1715   MCV 83.4 05/28/2016 1715  MCHC 33.4 05/28/2016 1715   RDW 13.9 05/28/2016 1715   LYMPHSABS 1.4 05/28/2016 1715   MONOABS 0.7 05/28/2016 1715   EOSABS 0.1 05/28/2016 1715   BASOSABS 0.0 05/28/2016 1715    Hgb A1C Lab Results  Component Value Date   HGBA1C 6.0 09/28/2015           Assessment & Plan:   Viral URI with Cough:  Advised him to get some rest and drink plenty of fluids Start Zyrtec daily x 1 week Refilled Cheratussin AC and Tessalon RX for Zpack given to patient, advised to start on Monday if no improvement in symptoms  Return precautions discussed Webb Silversmith, NP

## 2018-10-01 NOTE — Patient Instructions (Signed)

## 2018-11-24 ENCOUNTER — Telehealth: Payer: Self-pay | Admitting: Family Medicine

## 2018-11-24 NOTE — Telephone Encounter (Signed)
Pt need refill on    Methocarbamol due to he is having spasms again. Please advise   Sent to [facility]

## 2018-11-24 NOTE — Telephone Encounter (Signed)
Methocarbamol not on current med list. Last OV:  10/01/18, acute with Webb Silversmith Next OV:  none

## 2018-11-25 MED ORDER — METHOCARBAMOL 500 MG PO TABS: 500.0000 mg | ORAL_TABLET | Freq: Three times a day (TID) | ORAL | 0 refills | Status: DC | PRN

## 2018-11-25 NOTE — Telephone Encounter (Signed)
Refilled

## 2019-11-08 ENCOUNTER — Telehealth: Payer: Self-pay

## 2019-11-08 NOTE — Telephone Encounter (Signed)
Pt said for 3 days pt has had lightheadedness that is virtually constant. The room is not spinning but pt continues with lightheadedness like he has ridden a ride at a fair for 3-4 times in a row. Pt said feels like fluid moving in his ear and could easily lose his balance. No H/A,CP,weakness or SOB. Pt does not have a way to ck BP. UC & ED precautions given and pt voiced understanding. Pt scheduled in office visit with Dr Darnell Level on 11/09/19 at 10:15. FYI to Dr Darnell Level.

## 2019-11-09 ENCOUNTER — Ambulatory Visit (INDEPENDENT_AMBULATORY_CARE_PROVIDER_SITE_OTHER): Payer: Managed Care, Other (non HMO) | Admitting: Family Medicine

## 2019-11-09 ENCOUNTER — Encounter: Payer: Self-pay | Admitting: Family Medicine

## 2019-11-09 ENCOUNTER — Other Ambulatory Visit: Payer: Self-pay

## 2019-11-09 VITALS — BP 126/86 | HR 94 | Temp 97.7°F | Ht 72.0 in | Wt 253.2 lb

## 2019-11-09 DIAGNOSIS — Z23 Encounter for immunization: Secondary | ICD-10-CM

## 2019-11-09 DIAGNOSIS — R42 Dizziness and giddiness: Secondary | ICD-10-CM

## 2019-11-09 MED ORDER — MECLIZINE HCL 25 MG PO TABS: 25.0000 mg | ORAL_TABLET | Freq: Two times a day (BID) | ORAL | 0 refills | Status: DC | PRN

## 2019-11-09 NOTE — Progress Notes (Signed)
This visit was conducted in person.  BP 126/86 (BP Location: Left Arm, Patient Position: Sitting, Cuff Size: Large)   Pulse 94   Temp 97.7 F (36.5 C) (Temporal)   Ht 6' (1.829 m)   Wt 253 lb 4 oz (114.9 kg)   SpO2 97%   BMI 34.35 kg/m   No data found.  CC: dizziness Subjective:    Patient ID: Jordan Shelton, male    DOB: [birth year], 44 y.o.   MRN: [id number]  HPI: Jordan Shelton is a 44 y.o. male presenting on 11/09/2019 for Dizziness (C/o feeling lightheaded and off balance.  Feels like he just got off a roller coaster.  Started 3-4 days ago. )   Friday night had a few glasses of wine - not more than normal.  Symptoms started Saturday morning - when he woke up felt room spinning feeling when he got up out of bed - lasted 5 min - then another feeling later in the day that lasted about 5 minutes. Residual unsteadiness throughout the day worse with sudden head movements. Dizziness described as lightheaded feeling "like I just got off a roller coaster". No presyncope. Doesn't note vertigo predominance when moving head in one direction.  No HA, dyspnea, fever, nausea/vomiting, hearing changes. No recent URI. Mild tinnitus noted in a quiet environment.   Tried antihistamine and decongestant without benefit.  No h/o vertigo in the past.    No h/o meniere's. Mother with lupus and crohn's.      Relevant past medical, surgical, family and social history reviewed and updated as indicated. Interim medical history since our last visit reviewed. Allergies and medications reviewed and updated. Outpatient Medications Prior to Visit  Medication Sig Dispense Refill  . Coenzyme Q10 (CO Q 10 PO) Take 1 capsule by mouth.    Marland Kitchen glycopyrrolate (ROBINUL) 1 MG tablet Take 2 tablets by mouth 2 (two) times daily. As needed    . MEGARED OMEGA-3 KRILL OIL 500 MG CAPS Take 500 mg by mouth daily.    . methocarbamol (ROBAXIN) 500 MG tablet Take 1 tablet (500 mg total) by mouth 3 (three) times daily as needed for  muscle spasms (sedation precautions). 30 tablet 0  . Multiple Vitamin (MULTIVITAMIN) tablet Take 1 tablet by mouth daily.    Marland Kitchen omeprazole (PRILOSEC) 40 MG capsule Take one capsule daily (Patient taking differently: Take 40 mg by mouth daily. As needed) 90 capsule 3  . Potassium Gluconate 550 MG TABS Take 500 mg by mouth.    Marland Kitchen azithromycin (ZITHROMAX) 250 MG tablet Take 2 tabs today, then 1 tab daily x 4 days 6 tablet 0  . benzonatate (TESSALON) 100 MG capsule Take 1 capsule (100 mg total) by mouth 3 (three) times daily as needed for cough. 30 capsule 0  . guaiFENesin-codeine (CHERATUSSIN AC) 100-10 MG/5ML syrup Take 5 mLs by mouth 2 (two) times daily as needed. 120 mL 0   No facility-administered medications prior to visit.     Per HPI unless specifically indicated in ROS section below Review of Systems Objective:    BP 126/86 (BP Location: Left Arm, Patient Position: Sitting, Cuff Size: Large)   Pulse 94   Temp 97.7 F (36.5 C) (Temporal)   Ht 6' (1.829 m)   Wt 253 lb 4 oz (114.9 kg)   SpO2 97%   BMI 34.35 kg/m   Wt Readings from Last 3 Encounters:  11/09/19 253 lb 4 oz (114.9 kg)  10/01/18 232 lb (105.2 kg)  09/14/18 235 lb (  106.6 kg)    Physical Exam Vitals and nursing note reviewed.  Constitutional:      Appearance: Normal appearance. He is not ill-appearing.  HENT:     Nose: Nose normal.     Mouth/Throat:     Mouth: Mucous membranes are moist.     Pharynx: Oropharynx is clear. No oropharyngeal exudate or posterior oropharyngeal erythema.  Eyes:     Extraocular Movements: Extraocular movements intact.     Conjunctiva/sclera: Conjunctivae normal.     Pupils: Pupils are equal, round, and reactive to light.  Neck:     Vascular: No carotid bruit.  Cardiovascular:     Rate and Rhythm: Normal rate and regular rhythm.     Pulses: Normal pulses.     Heart sounds: Normal heart sounds. No murmur.  Pulmonary:     Effort: Pulmonary effort is normal. No respiratory distress.       Breath sounds: Normal breath sounds. No wheezing, rhonchi or rales.  Musculoskeletal:     Cervical back: Normal range of motion and neck supple.     Right lower leg: No edema.     Left lower leg: No edema.  Skin:    General: Skin is warm and dry.     Capillary Refill: Capillary refill takes less than 2 seconds.     Findings: No rash.  Neurological:     General: No focal deficit present.     Mental Status: He is alert.     Cranial Nerves: Cranial nerves are intact.     Sensory: Sensation is intact.     Motor: Motor function is intact.     Coordination: Coordination is intact. Romberg sign negative. Finger-Nose-Finger Test normal. Rapid alternating movements normal.     Gait: Gait is intact.     Comments:  CN 2-12 intact FTN intact No pronator drift Dix hallpike maneuver negative bilaterally  Psychiatric:        Mood and Affect: Mood normal.        Behavior: Behavior normal.       Assessment & Plan:  This visit occurred during the SARS-CoV-2 public health emergency.  Safety protocols were in place, including screening questions prior to the visit, additional usage of staff PPE, and extensive cleaning of exam room while observing appropriate contact time as indicated for disinfecting solutions.   Problem List Items Addressed This Visit    Vertigo - Primary    Story most consistent with peripheral vertigo cause like benign positional positional vertigo although Dix-Hallpike negative. No red flags today, non-focal neurological exam. Will send home with modified epley maneuvers, discussed trial meclizine PRN vertigo. Reviewed anticipated course of recovery. Update if persistent or worsening symptoms.        Other Visit Diagnoses    Need for influenza vaccination       Relevant Orders   Flu Vaccine QUAD 36+ mos IM (Completed)       Meds ordered this encounter  Medications  . meclizine (ANTIVERT) 25 MG tablet    Sig: Take 1 tablet (25 mg total) by mouth 2 (two) times daily  as needed for dizziness.    Dispense:  20 tablet    Refill:  0   Orders Placed This Encounter  Procedures  . Flu Vaccine QUAD 36+ mos IM    Follow up plan: Return if symptoms worsen or fail to improve.  Ria Bush, MD

## 2019-11-09 NOTE — Patient Instructions (Signed)
I think you have positional vertigo.  Try epley maneuvers at home.  May use meclizine if needed for dizziness (with sedation precautions).  Drink plenty of fluid.   Benign Positional Vertigo Vertigo is the feeling that you or your surroundings are moving when they are not. Benign positional vertigo is the most common form of vertigo. This is usually a harmless condition (benign). This condition is positional. This means that symptoms are triggered by certain movements and positions. This condition can be dangerous if it occurs while you are doing something that could cause harm to you or others. This includes activities such as driving or operating machinery. What are the causes? In many cases, the cause of this condition is not known. It may be caused by a disturbance in an area of the inner ear that helps your brain to sense movement and balance. This disturbance can be caused by:  Viral infection (labyrinthitis).  Head injury.  Repetitive motion, such as jumping, dancing, or running. What increases the risk? You are more likely to develop this condition if:  You are a woman.  You are 42 years of age or older. What are the signs or symptoms? Symptoms of this condition usually happen when you move your head or your eyes in different directions. Symptoms may start suddenly, and usually last for less than a minute. They include:  Loss of balance and falling.  Feeling like you are spinning or moving.  Feeling like your surroundings are spinning or moving.  Nausea and vomiting.  Blurred vision.  Dizziness.  Involuntary eye movement (nystagmus). Symptoms can be mild and cause only minor problems, or they can be severe and interfere with daily life. Episodes of benign positional vertigo may return (recur) over time. Symptoms may improve over time. How is this diagnosed? This condition may be diagnosed based on:  Your medical history.  Physical exam of the head, neck, and  ears.  Tests, such as: ? MRI. ? CT scan. ? Eye movement tests. Your health care provider may ask you to change positions quickly while he or she watches you for symptoms of benign positional vertigo, such as nystagmus. Eye movement may be tested with a variety of exams that are designed to evaluate or stimulate vertigo. ? An electroencephalogram (EEG). This records electrical activity in your brain. ? Hearing tests. You may be referred to a health care provider who specializes in ear, nose, and throat (ENT) problems (otolaryngologist) or a provider who specializes in disorders of the nervous system (neurologist). How is this treated?  This condition may be treated in a session in which your health care provider moves your head in specific positions to adjust your inner ear back to normal. Treatment for this condition may take several sessions. Surgery may be needed in severe cases, but this is rare. In some cases, benign positional vertigo may resolve on its own in 2-4 weeks. Follow these instructions at home: Safety  Move slowly. Avoid sudden body or head movements or certain positions, as told by your health care provider.  Avoid driving until your health care provider says it is safe for you to do so.  Avoid operating heavy machinery until your health care provider says it is safe for you to do so.  Avoid doing any tasks that would be dangerous to you or others if vertigo occurs.  If you have trouble walking or keeping your balance, try using a cane for stability. If you feel dizzy or unstable, sit down right  away.  Return to your normal activities as told by your health care provider. Ask your health care provider what activities are safe for you. General instructions  Take over-the-counter and prescription medicines only as told by your health care provider.  Drink enough fluid to keep your urine pale yellow.  Keep all follow-up visits as told by your health care provider. This  is important. Contact a health care provider if:  You have a fever.  Your condition gets worse or you develop new symptoms.  Your family or friends notice any behavioral changes.  You have nausea or vomiting that gets worse.  You have numbness or a "pins and needles" sensation. Get help right away if you:  Have difficulty speaking or moving.  Are always dizzy.  Faint.  Develop severe headaches.  Have weakness in your legs or arms.  Have changes in your hearing or vision.  Develop a stiff neck.  Develop sensitivity to light. Summary  Vertigo is the feeling that you or your surroundings are moving when they are not. Benign positional vertigo is the most common form of vertigo.  The cause of this condition is not known. It may be caused by a disturbance in an area of the inner ear that helps your brain to sense movement and balance.  Symptoms include loss of balance and falling, feeling that you or your surroundings are moving, nausea and vomiting, and blurred vision.  This condition can be diagnosed based on symptoms, physical exam, and other tests, such as MRI, CT scan, eye movement tests, and hearing tests.  Follow safety instructions as told by your health care provider. You will also be told when to contact your health care provider in case of problems. This information is not intended to replace advice given to you by your health care provider. Make sure you discuss any questions you have with your health care provider. Document Revised: 02/04/2018 Document Reviewed: 02/04/2018 Elsevier Patient Education  [place].

## 2019-11-13 ENCOUNTER — Encounter: Payer: Self-pay | Admitting: Family Medicine

## 2019-11-13 DIAGNOSIS — R42 Dizziness and giddiness: Secondary | ICD-10-CM | POA: Insufficient documentation

## 2019-11-13 DIAGNOSIS — H8110 Benign paroxysmal vertigo, unspecified ear: Secondary | ICD-10-CM | POA: Insufficient documentation

## 2019-11-13 NOTE — Assessment & Plan Note (Signed)
Story most consistent with peripheral vertigo cause like benign positional positional vertigo although Dix-Hallpike negative. No red flags today, non-focal neurological exam. Will send home with modified epley maneuvers, discussed trial meclizine PRN vertigo. Reviewed anticipated course of recovery. Update if persistent or worsening symptoms.

## 2019-11-23 ENCOUNTER — Other Ambulatory Visit: Payer: Self-pay | Admitting: Neurology

## 2019-11-23 DIAGNOSIS — R42 Dizziness and giddiness: Secondary | ICD-10-CM

## 2019-12-03 ENCOUNTER — Other Ambulatory Visit: Payer: Self-pay

## 2019-12-03 ENCOUNTER — Ambulatory Visit
Admission: RE | Admit: 2019-12-03 | Discharge: 2019-12-03 | Disposition: A | Discharge status: Home / Self Care | Payer: Managed Care, Other (non HMO) | Source: Ambulatory Visit | Attending: Neurology | Admitting: Neurology

## 2019-12-03 DIAGNOSIS — R42 Dizziness and giddiness: Secondary | ICD-10-CM | POA: Diagnosis present

## 2019-12-03 MED ORDER — GADOBUTROL 1 MMOL/ML IV SOLN
10.0000 mL | Freq: Once | INTRAVENOUS | Status: AC | PRN
  Administered 2019-12-03: 17:00:00 10 mL via INTRAVENOUS

## 2020-01-21 ENCOUNTER — Telehealth: Payer: Self-pay

## 2020-01-21 NOTE — Telephone Encounter (Signed)
Pt reports he went to [facility] in [place]. They did an EKG and "did not see anything alarming." He said they advised if symptoms persist to f/u with PCP in the next week. His BP was elevated but did come down after time to relax after getting to the clinic. Advised pt of ER protocol and that records would be requested so they could be reviewed by PCP. Pt consented and verbalized understanding.   LVM at [facility] for medical records and all testing.

## 2020-01-21 NOTE — Telephone Encounter (Signed)
[facility] - Client TELEPHONE ADVICE RECORD [facility] Patient Name: Jordan Shelton Gender: Male DOB: [birth year] Age: 44 Y 26 D Return Phone Number: [phone] (Primary) Address: City/State/Zip: [place] [address] Client [facility] - Client Client Site [place] - Day Physician Ria Bush - MD Contact Type Call Who Is Calling Patient / Member / Family / Caregiver Call Type Triage / Clinical Relationship To Patient Self Return Phone Number [phone] (Primary) Chief Complaint CHEST PAIN (>=21 years) - pain, pressure, heaviness or tightness Reason for Call Symptomatic / Request for [place] states the patient is having chest pain on left side. Heart palpitations. Additional Comment He noticed about a week ago. Doesn't happen all the time. He has muscle cramp in chest. Translation No Nurse Assessment Nurse: Loreen Freud, RN, [place] Date/Time (Eastern Time): 01/21/2020 10:41:20 AM Confirm and document reason for call. If symptomatic, describe symptoms. ---Caller states the patient is having chest pain and pressure on the left side as well as heart palpitations. Doesn't happen all the time. He has muscle cramp in chest. Has the patient had close contact with a person known or suspected to have the novel coronavirus illness OR traveled / lives in area with major community spread (including international travel) in the last 14 days from the onset of symptoms? * If Asymptomatic, screen for exposure and travel within the last 14 days. ---No Does the patient have any new or worsening symptoms? ---Yes Will a triage be completed? ---Yes Related visit to physician within the last 2 weeks? ---No Does the PT have any chronic conditions? (i.e. diabetes, asthma, this includes High risk factors for pregnancy, etc.) ---No Is this a behavioral health or substance abuse call?  ---No Guidelines Guideline Title Affirmed Question Affirmed Notes Nurse Date/Time (Eastern Time) Chest Pain [1] Chest pain lasts > 5 minutes AND [2] described as crushing, pressure-like, or heavy [place], RN, Crystal 01/21/2020 10:43:20 AMPLEASE NOTE: All timestamps contained within this report are represented as [place] Standard Time. CONFIDENTIALTY NOTICE: This fax transmission is intended only for the addressee. It contains information that is legally privileged, confidential or otherwise protected from use or disclosure. If you are not the intended recipient, you are strictly prohibited from reviewing, disclosing, copying using or disseminating any of this information or taking any action in reliance on or regarding this information. If you have received this fax in error, please notify us immediately by telephone so that we can arrange for its return to [place]. Phone: [phone], Toll-Free: [phone], Fax: [phone] Page: 2 of 2 Call Id: [id number] [place]. Time Eilene Ghazi Time) Disposition Final User 01/21/2020 10:39:15 AM Send to Urgent Queue Artis Flock 01/21/2020 10:57:26 AM 911 Outcome Documentation [place], RN, [place] Reason: Caller transferred to backline per directive. 01/21/2020 10:48:13 AM Call EMS 911 Now Yes [place], RN, [place] Caller Disagree/Comply Disagree Caller Understands Yes PreDisposition Did not know what to do Care Advice Given Per Guideline CALL EMS 911 NOW: * Immediate medical attention is needed. You need to hang up and call 911 (or an ambulance). * Triager Discretion: I'll call you back in a few minutes to be sure you were able to reach them. CARE ADVICE given per Chest Pain (Adult) guideline. Comments User: Willette Brace, RN Date/Time Eilene Ghazi Time): 01/21/2020 10:56:50 AM Caller verbalized understanding that RN recommend calling 911 now. Referrals Warm transfer to backline

## 2020-01-21 NOTE — Telephone Encounter (Signed)
Pt c/o chest pain x 1 wk which has dissipated when taking aspirin. Pt denies SOB, jaw or neck pain or any radiating pain, or nausea. Pt reports he has been working hard this week and it has happened with exertion and while at rest. Pt states "I do not think it is a big deal because it has been occurring for a week." Advised pt he should call 911 or go to the ER. Pt agreed to go to the ER but he reports he will call first to let them know he is coming. Advised of need for urgent ER assessment. Pt verbalized understanding and said he would go.

## 2020-01-22 NOTE — Telephone Encounter (Signed)
Glad workup was ok Would offer appt for f/u Friday if desired.

## 2020-01-24 NOTE — Telephone Encounter (Signed)
Noted.  Spoke with pt scheduling UC f/u OV on 01/28/20 at 11:30.

## 2020-01-28 ENCOUNTER — Other Ambulatory Visit: Payer: Self-pay

## 2020-01-28 ENCOUNTER — Ambulatory Visit (INDEPENDENT_AMBULATORY_CARE_PROVIDER_SITE_OTHER): Payer: Managed Care, Other (non HMO) | Admitting: Family Medicine

## 2020-01-28 ENCOUNTER — Encounter: Payer: Self-pay | Admitting: Family Medicine

## 2020-01-28 VITALS — BP 132/90 | HR 97 | Temp 98.0°F | Ht 72.0 in | Wt 246.4 lb

## 2020-01-28 DIAGNOSIS — H8110 Benign paroxysmal vertigo, unspecified ear: Secondary | ICD-10-CM

## 2020-01-28 DIAGNOSIS — G5603 Carpal tunnel syndrome, bilateral upper limbs: Secondary | ICD-10-CM

## 2020-01-28 DIAGNOSIS — R0789 Other chest pain: Secondary | ICD-10-CM | POA: Insufficient documentation

## 2020-01-28 DIAGNOSIS — K760 Fatty (change of) liver, not elsewhere classified: Secondary | ICD-10-CM

## 2020-01-28 DIAGNOSIS — K219 Gastro-esophageal reflux disease without esophagitis: Secondary | ICD-10-CM

## 2020-01-28 DIAGNOSIS — R202 Paresthesia of skin: Secondary | ICD-10-CM | POA: Insufficient documentation

## 2020-01-28 DIAGNOSIS — R030 Elevated blood-pressure reading, without diagnosis of hypertension: Secondary | ICD-10-CM | POA: Insufficient documentation

## 2020-01-28 DIAGNOSIS — I10 Essential (primary) hypertension: Secondary | ICD-10-CM | POA: Insufficient documentation

## 2020-01-28 DIAGNOSIS — R7303 Prediabetes: Secondary | ICD-10-CM

## 2020-01-28 LAB — CBC WITH DIFFERENTIAL/PLATELET
Basophils Absolute: 0 10*3/uL (ref 0.0–0.1)
Basophils Relative: 0.6 % (ref 0.0–3.0)
Eosinophils Absolute: 0.1 10*3/uL (ref 0.0–0.7)
Eosinophils Relative: 2 % (ref 0.0–5.0)
HCT: 46.2 % (ref 39.0–52.0)
Hemoglobin: 15.2 g/dL (ref 13.0–17.0)
Lymphocytes Relative: 26.9 % (ref 12.0–46.0)
Lymphs Abs: 1.6 10*3/uL (ref 0.7–4.0)
MCHC: 32.9 g/dL (ref 30.0–36.0)
MCV: 86 fl (ref 78.0–100.0)
Monocytes Absolute: 0.6 10*3/uL (ref 0.1–1.0)
Monocytes Relative: 9.1 % (ref 3.0–12.0)
Neutro Abs: 3.7 10*3/uL (ref 1.4–7.7)
Neutrophils Relative %: 61.4 % (ref 43.0–77.0)
Platelets: 225 10*3/uL (ref 150.0–400.0)
RBC: 5.37 Mil/uL (ref 4.22–5.81)
RDW: 14.3 % (ref 11.5–15.5)
WBC: 6.1 10*3/uL (ref 4.0–10.5)

## 2020-01-28 LAB — COMPREHENSIVE METABOLIC PANEL
ALT: 42 U/L (ref 0–53)
AST: 23 U/L (ref 0–37)
Albumin: 4.8 g/dL (ref 3.5–5.2)
Alkaline Phosphatase: 63 U/L (ref 39–117)
BUN: 16 mg/dL (ref 6–23)
CO2: 28 mEq/L (ref 19–32)
Calcium: 9.9 mg/dL (ref 8.4–10.5)
Chloride: 102 mEq/L (ref 96–112)
Creatinine, Ser: 1.15 mg/dL (ref 0.40–1.50)
GFR: 69.71 mL/min (ref >60.00)
Glucose, Bld: 108 mg/dL — ABNORMAL HIGH (ref 70–99)
Potassium: 4.3 mEq/L (ref 3.5–5.1)
Sodium: 137 mEq/L (ref 135–145)
Total Bilirubin: 0.4 mg/dL (ref 0.2–1.2)
Total Protein: 7.9 g/dL (ref 6.0–8.3)

## 2020-01-28 LAB — HEMOGLOBIN A1C: Hgb A1c MFr Bld: 6 % (ref 4.6–6.5)

## 2020-01-28 LAB — VITAMIN B12: Vitamin B-12: 452 pg/mL (ref 211–911)

## 2020-01-28 LAB — TSH: TSH: 1.74 u[IU]/mL (ref 0.35–4.50)

## 2020-01-28 MED ORDER — METHOCARBAMOL 500 MG PO TABS: 500.0000 mg | ORAL_TABLET | Freq: Three times a day (TID) | ORAL | 3 refills | Status: DC | PRN

## 2020-01-28 MED ORDER — MAGNESIUM 400 MG PO CAPS: 1.0000 | ORAL_CAPSULE | Freq: Every day | ORAL | Status: AC

## 2020-01-28 MED ORDER — OMEPRAZOLE 20 MG PO CPDR: 20.0000 mg | DELAYED_RELEASE_CAPSULE | Freq: Every day | ORAL | Status: AC | PRN

## 2020-01-28 NOTE — Assessment & Plan Note (Signed)
Reviewed - advised to start monitoring closely at home and let me know if consistently >140/90 to discuss medication.

## 2020-01-28 NOTE — Assessment & Plan Note (Signed)
Managed with PRN PPI 

## 2020-01-28 NOTE — Assessment & Plan Note (Signed)
L>R. Discussed conservative management with wrist brace, ibuprofen and attention to good ergonomics at home workstation. Update if worsening to consider hand surgery eval.

## 2020-01-28 NOTE — Patient Instructions (Addendum)
Keep an eye on blood pressures - let me know if consistently >140/90 Chest pain episodes sound muscular - as they happened after exercise. May try robaxin for this. Let me know if worsening despite that or new symptoms developing.  You do have L>R carpal tunnel syndrome. Work on Personal assistant at work station, consider wrist brace left > right hands. Let me know if worsening symptoms We have requested records from urgent care to review. Labs today (b12, thyroid, sugar, etc).

## 2020-01-28 NOTE — Assessment & Plan Note (Addendum)
Appreciate neuro care - reassuring MRI, dx BPPV.

## 2020-01-28 NOTE — Assessment & Plan Note (Signed)
Sounds non cardiac in nature but rather musculoskeletal after work-out in yard. Discussed PRN robaxin use. Update if worsening symptoms. Will await records from [place] eval.

## 2020-01-28 NOTE — Assessment & Plan Note (Addendum)
Update A1c with newly endorsed paresthesias

## 2020-01-28 NOTE — Assessment & Plan Note (Signed)
Update LFT's 

## 2020-01-28 NOTE — Progress Notes (Addendum)
This visit was conducted in person.  BP 132/90 (BP Location: Left Arm, Patient Position: Sitting, Cuff Size: Large)   Pulse 97   Temp 98 F (36.7 C) (Temporal)   Ht 6' (1.829 m)   Wt 246 lb 6 oz (111.8 kg)   SpO2 99%   BMI 33.41 kg/m   BP Readings from Last 3 Encounters:  01/28/20 132/90  11/09/19 126/86  10/01/18 124/82    CC: f/u UCC visit Subjective:    Patient ID: Jordan Shelton, male    DOB: [birth year], 44 y.o.   MRN: [id number]  HPI: Jordan Shelton is a 44 y.o. male presenting on 01/28/2020 for Hospitalization Follow-up (Seen at [facility] on 01/21/20.)   See recent phone note. 1 wk of chest pain that improved with aspirin s/p [facility] eval in [place]. States had reassuring evaluation including EKG. UCC records not yet available - have requested again.   Symptoms started ~01/14/2020 - after he was physically active working in the yard. The next night he felt dull throb to L chest wall improved with aspirin or ibuprofen. Felt better next AM but progressively worsened as the day progressed. Since then, chest pain has largely subsided. Does still note sharp chest wall pains with certain movements.   Feels intermittent buzzing vibration feeling to L foot. Notes some paresthesias with numbness to bilateral hands L>R. 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 No alcohol, no known DM. No cervical neck or back pain. No significant alcohol intake (just on Fridays).   No exertional chest pain - actually feels better after activity.  No GERD sxs with chest pain.  He takes omeprazole very sporadically.  Never fevers/chills, nausea/vomiting, dyspnea, cough.   Ongoing dizziness s/p neurology eval with negative MRI with internal auditory canal protocol Manuella Ghazi). Thought BPPV but he had other symptoms of mental fogginess, HA, esotropia R>L.      Relevant past medical, surgical, family and social history reviewed and updated as indicated. Interim medical  history since our last visit reviewed. Allergies and medications reviewed and updated. Outpatient Medications Prior to Visit  Medication Sig Dispense Refill  . Coenzyme Q10 (CO Q 10 PO) Take 1 capsule by mouth.    Marland Kitchen glycopyrrolate (ROBINUL) 1 MG tablet Take 2 tablets (2 mg total) by mouth 2 (two) times daily. As needed for hyperhidrosis    . MEGARED OMEGA-3 KRILL OIL 500 MG CAPS Take 500 mg by mouth daily.    . Multiple Vitamin (MULTIVITAMIN) tablet Take 1 tablet by mouth daily.    . Potassium Gluconate 550 MG TABS Take 500 mg by mouth.    Marland Kitchen glycopyrrolate (ROBINUL) 1 MG tablet Take 2 tablets by mouth 2 (two) times daily. As needed    . methocarbamol (ROBAXIN) 500 MG tablet Take 1 tablet (500 mg total) by mouth 3 (three) times daily as needed for muscle spasms (sedation precautions). 30 tablet 0  . omeprazole (PRILOSEC) 40 MG capsule Take one capsule daily (Patient taking differently: Take 40 mg by mouth daily. As needed) 90 capsule 3  . meclizine (ANTIVERT) 25 MG tablet Take 1 tablet (25 mg total) by mouth 2 (two) times daily as needed for dizziness. 20 tablet 0   No facility-administered medications prior to visit.     Per HPI unless specifically indicated in ROS section below Review of Systems Objective:  BP 132/90 (BP Location: Left Arm, Patient Position: Sitting, Cuff Size: Large)   Pulse 97   Temp 98  F (36.7 C) (Temporal)   Ht 6' (1.829 m)   Wt 246 lb 6 oz (111.8 kg)   SpO2 99%   BMI 33.41 kg/m   Wt Readings from Last 3 Encounters:  01/28/20 246 lb 6 oz (111.8 kg)  11/09/19 253 lb 4 oz (114.9 kg)  10/01/18 232 lb (105.2 kg)      Physical Exam Vitals and nursing note reviewed.  Constitutional:      Appearance: Normal appearance. He is obese. He is not ill-appearing.  HENT:     Head: Normocephalic and atraumatic.  Eyes:     Extraocular Movements: Extraocular movements intact.     Pupils: Pupils are equal, round, and reactive to light.  Cardiovascular:     Rate and  Rhythm: Normal rate and regular rhythm.     Pulses: Normal pulses.     Heart sounds: Normal heart sounds. No murmur.  Pulmonary:     Effort: Pulmonary effort is normal. No respiratory distress.     Breath sounds: Normal breath sounds. No wheezing, rhonchi or rales.     Comments: No reproducible chest wall or ribcage pain Chest:     Chest wall: No tenderness.  Abdominal:     General: Abdomen is flat. Bowel sounds are normal. There is no distension.     Palpations: Abdomen is soft. There is no mass.     Tenderness: There is no abdominal tenderness. There is no guarding or rebound.     Hernia: No hernia is present.  Musculoskeletal:        General: Normal range of motion.     Right lower leg: No edema.     Left lower leg: No edema.  Skin:    General: Skin is warm and dry.     Findings: No rash.  Neurological:     Mental Status: He is alert.     Motor: Motor function is intact.     Coordination: Coordination is intact.     Comments:  CN grossly intact ++ tinel L>R + phalen on left  Psychiatric:        Mood and Affect: Mood normal.        Behavior: Behavior normal.       No results found for: VITAMINB12  Lab Results  Component Value Date   TSH 1.11 05/28/2016    Assessment & Plan:  This visit occurred during the SARS-CoV-2 public health emergency.  Safety protocols were in place, including screening questions prior to the visit, additional usage of staff PPE, and extensive cleaning of exam room while observing appropriate contact time as indicated for disinfecting solutions.   Problem List Items Addressed This Visit    Prediabetes    Update A1c with newly endorsed paresthesias      RESOLVED: Paresthesia   Relevant Orders   Comprehensive metabolic panel   TSH   CBC with Differential/Platelet   Vitamin B12   Hemoglobin A1c   Other chest pain - Primary    Sounds non cardiac in nature but rather musculoskeletal after work-out in yard. Discussed PRN robaxin use. Update  if worsening symptoms. Will await records from [facility] eval.       GERD (gastroesophageal reflux disease)    Managed with PRN PPI      Relevant Medications   glycopyrrolate (ROBINUL) 1 MG tablet   omeprazole (PRILOSEC) 20 MG capsule   Fatty liver disease, nonalcoholic    Update LFTs      Elevated blood pressure reading  Reviewed - advised to start monitoring closely at home and let me know if consistently >140/90 to discuss medication.       Carpal tunnel syndrome on both sides    L>R. Discussed conservative management with wrist brace, ibuprofen and attention to good ergonomics at home workstation. Update if worsening to consider hand surgery eval.       Relevant Medications   methocarbamol (ROBAXIN) 500 MG tablet   BPV (benign positional vertigo)    Appreciate neuro care - reassuring MRI, dx BPPV.           Meds ordered this encounter  Medications  . Magnesium 400 MG CAPS    Sig: Take 1 capsule by mouth daily.  Marland Kitchen omeprazole (PRILOSEC) 20 MG capsule    Sig: Take 1 capsule (20 mg total) by mouth daily as needed (reflux).  . methocarbamol (ROBAXIN) 500 MG tablet    Sig: Take 1 tablet (500 mg total) by mouth 3 (three) times daily as needed for muscle spasms (sedation precautions).    Dispense:  30 tablet    Refill:  3   Orders Placed This Encounter  Procedures  . Comprehensive metabolic panel  . TSH  . CBC with Differential/Platelet  . Vitamin B12  . Hemoglobin A1c    Patient Instructions  Keep an eye on blood pressures - let me know if consistently >140/90 Chest pain episodes sound muscular - as they happened after exercise. May try robaxin for this. Let me know if worsening despite that or new symptoms developing.  You do have L>R carpal tunnel syndrome. Work on Personal assistant at work station, consider wrist brace left > right hands. Let me know if worsening symptoms We have requested records from urgent care to review. Labs today (b12, thyroid, sugar, etc).      Follow up plan: Return if symptoms worsen or fail to improve.  Ria Bush, MD

## 2020-01-31 ENCOUNTER — Telehealth: Payer: Self-pay | Admitting: Cardiovascular Disease

## 2020-01-31 NOTE — Telephone Encounter (Signed)
Spoke with patient and he states there is a defined area of discomfort. He reports that he would like to come in for further evaluation because this has been an ongoing thing and he wants to make sure it is nothing serious. Confirmed his appointment for tomorrow and he had no further questions at this time.

## 2020-01-31 NOTE — Progress Notes (Signed)
Cardiology Office Note  Date:  02/01/2020   ID:  Jordan Shelton, DOB Jul 08, 1976, MRN [id number]  PCP:  Ria Bush, MD   Chief Complaint  Patient presents with  . OTHER    Est. Care c/o CP x3wks now. Meds reviewed verbally with pt.    HPI:  Jordan Shelton is a 44 year old gentleman with past medical history of Hyperlipidemia Who presents by referral from Dr. Danise Mina for consultation of his chest pain  Beginning of May 2021, working in garden, Museum/gallery curator, building flower bed Next day with left chest pain Lasted several days Seemed to spread , left flank, arm, 2-3/10 Taught karate class  Went to urgent care bp elevated, EKG "fine"  Developed into Arm pressure and pain Numb tingly  Tingle left foot, on and off, vibrate  Still with quick stabs in chest with lifting things  Vertigo in 11/2019, saw Dr. Judd Gaudier Normal MRI brain with contrast with special attention to the posterior fossa.  Feels better after taykwando  Did some last night Felt a little something running and kicking at same time, most the time was fine  Nonsmoker HBA1c 6.0  Labs from 2017 LDL 148,  Total chol 250  Family hx: no CAD Mom with pacer  EKG personally reviewed by myself on todays visit Shows normal sinus rhythm rate 85 bpm no significant ST-T wave changes   PMH:   has a past medical history of Dysplastic nevus of trunk (03/2014), GERD (gastroesophageal reflux disease), and History of chickenpox.  PSH:    Past Surgical History:  Procedure Laterality Date  . abd [place]  06/2011   fatty liver  . CYSTECTOMY     benign  . MOLE REMOVAL      Current Outpatient Medications  Medication Sig Dispense Refill  . Coenzyme Q10 (CO Q 10 PO) Take 1 capsule by mouth.    Marland Kitchen glycopyrrolate (ROBINUL) 1 MG tablet Take 2 tablets (2 mg total) by mouth 2 (two) times daily. As needed for hyperhidrosis    . Magnesium 400 MG CAPS Take 1 capsule by mouth daily.    Marland Kitchen MEGARED OMEGA-3 KRILL OIL  500 MG CAPS Take 500 mg by mouth daily.    . methocarbamol (ROBAXIN) 500 MG tablet Take 1 tablet (500 mg total) by mouth 3 (three) times daily as needed for muscle spasms (sedation precautions). 30 tablet 3  . Multiple Vitamin (MULTIVITAMIN) tablet Take 1 tablet by mouth daily.    Marland Kitchen omeprazole (PRILOSEC) 20 MG capsule Take 1 capsule (20 mg total) by mouth daily as needed (reflux).    . Potassium Gluconate 550 MG TABS Take 500 mg by mouth.     No current facility-administered medications for this visit.     Allergies:   Patient has no known allergies.   Social History:  The patient  reports that he quit smoking about 16 years ago. He has never used smokeless tobacco. He reports current alcohol use. He reports that he does not use drugs.   Family History:   family history includes CAD (age of onset: 84) in his maternal grandfather; Crohn's disease in his mother; Heart disease in his mother; Hypertension in his father; Lupus in his mother; Prostate cancer (age of onset: 82) in his father; Stroke in his maternal grandmother; Thyroid disease in his mother.    Review of Systems: Review of Systems  Constitutional: Negative.   HENT: Negative.   Respiratory: Negative.   Cardiovascular: Negative.   Gastrointestinal: Negative.   Musculoskeletal:  Negative.   Neurological: Negative.   Psychiatric/Behavioral: Negative.   All other systems reviewed and are negative.    PHYSICAL EXAM: VS:  BP (!) 148/102 (BP Location: Right Arm, Patient Position: Sitting, Cuff Size: Large)   Pulse 85   Ht 6' (1.829 m)   Wt 246 lb 2 oz (111.6 kg)   SpO2 98%   BMI 33.38 kg/m  , BMI Body mass index is 33.38 kg/m. GEN: Well nourished, well developed, in no acute distress HEENT: normal Neck: no JVD, carotid bruits, or masses Cardiac: RRR; no murmurs, rubs, or gallops,no edema  Respiratory:  clear to auscultation bilaterally, normal work of breathing GI: soft, nontender, nondistended, + BS MS: no deformity or  atrophy Skin: warm and dry, no rash Neuro:  Strength and sensation are intact Psych: euthymic mood, full affect   Recent Labs: 01/28/2020: ALT 42; BUN 16; Creatinine, Ser 1.15; Hemoglobin 15.2; Platelets 225.0; Potassium 4.3; Sodium 137; TSH 1.74    Lipid Panel Lab Results  Component Value Date   CHOL 250 (H) 09/28/2015   HDL 43.40 09/28/2015   LDLCALC 165 (H) 11/12/2013   TRIG 291.0 (H) 09/28/2015      Wt Readings from Last 3 Encounters:  02/01/20 246 lb 2 oz (111.6 kg)  01/28/20 246 lb 6 oz (111.8 kg)  11/09/19 253 lb 4 oz (114.9 kg)       ASSESSMENT AND PLAN:  Problem List Items Addressed This Visit      Cardiology Problems   HLD (hyperlipidemia)     Other   Prediabetes   Other chest pain - Primary   Relevant Orders   EKG 12-Lead   CT CARDIAC SCORING     Atypical chest pain Likely musculoskeletal in nature 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   Disposition:   F/U as needed   Total encounter time more than 60 minutes  Greater than 50% was spent in counseling and coordination of care with the patient    Signed, Esmond Plants, M.D., Ph.D. [place], [place]

## 2020-02-01 ENCOUNTER — Encounter: Payer: Self-pay | Admitting: Cardiovascular Disease

## 2020-02-01 ENCOUNTER — Other Ambulatory Visit: Payer: Self-pay

## 2020-02-01 ENCOUNTER — Ambulatory Visit (INDEPENDENT_AMBULATORY_CARE_PROVIDER_SITE_OTHER): Payer: Managed Care, Other (non HMO) | Admitting: Cardiovascular Disease

## 2020-02-01 VITALS — BP 148/102 | HR 85 | Ht 72.0 in | Wt 246.1 lb

## 2020-02-01 DIAGNOSIS — R0789 Other chest pain: Secondary | ICD-10-CM

## 2020-02-01 DIAGNOSIS — E782 Mixed hyperlipidemia: Secondary | ICD-10-CM

## 2020-02-01 DIAGNOSIS — R7303 Prediabetes: Secondary | ICD-10-CM

## 2020-02-01 NOTE — Patient Instructions (Addendum)
CT coronary calcium score, for chest pain  We will order CT coronary calcium score $154.42 at our [facility] in [place]  Please call [phone] to schedule [facility] [place], [place] [id number]  Monitor blood pressure Call with numbers  Make sure to check Blood Pressures 2 hours after your medications.   Please keep a log of your readings so we can see how they trend.  Avoid these things for 30 minutes before checking your blood pressure:  Drinking caffeine.  Drinking alcohol.  Eating.  Smoking.  Exercising.  Five minutes before checking your blood pressure:  Pee.  Sit in a dining chair. Avoid sitting in a soft couch or armchair.  Be quiet. Do not talk.   Medication Instructions:  No changes  If you need a refill on your cardiac medications before your next appointment, please call your pharmacy.    Lab work: No new labs needed   If you have labs (blood work) drawn today and your tests are completely normal, you will receive your results only by: Marland Kitchen MyChart Message (if you have MyChart) OR . A paper copy in the mail If you have any lab test that is abnormal or we need to change your treatment, we will call you to review the results.   Testing/Procedures: See above   Follow-Up: At [facility], you and your health needs are our priority.  As part of our continuing mission to provide you with exceptional heart care, we have created designated Provider Care Teams.  These Care Teams include your primary Cardiologist (physician) and Advanced Practice Providers (APPs -  Physician Assistants and Nurse Practitioners) who all work together to provide you with the care you need, when you need it.  . You will need a follow up appointment as needed  . Providers on your designated Care Team:   . Murray Hodgkins, NP . Christell Faith, PA-C . Marrianne Mood, PA-C  Any Other Special  Instructions Will Be Listed Below (If Applicable).  For educational health videos Log in to : www.myemmi.com Or : [email or web address], password : [email or web address]   How to Take Your Blood Pressure You can take your blood pressure at home with a machine. You may need to check your blood pressure at home:  To check if you have high blood pressure (hypertension).  To check your blood pressure over time.  To make sure your blood pressure medicine is working. Supplies needed: You will need a blood pressure machine, or monitor. You can buy one at a drugstore or online. When choosing one:  Choose one with an arm cuff.  Choose one that wraps around your upper arm. Only one finger should fit between your arm and the cuff.  Do not choose one that measures your blood pressure from your wrist or finger. Your doctor can suggest a monitor. How to prepare Avoid these things for 30 minutes before checking your blood pressure:  Drinking caffeine.  Drinking alcohol.  Eating.  Smoking.  Exercising. Five minutes before checking your blood pressure:  Pee.  Sit in a dining chair. Avoid sitting in a soft couch or armchair.  Be quiet. Do not talk. How to take your blood pressure Follow the instructions that came with your machine. If you have a digital blood pressure monitor, these may be the instructions: 1. Sit up straight. 2. Place your feet on the floor. Do not cross your ankles or legs. 3. Rest your left arm  at the level of your heart. You may rest it on a table, desk, or chair. 4. Pull up your shirt sleeve. 5. Wrap the blood pressure cuff around the upper part of your left arm. The cuff should be 1 inch (2.5 cm) above your elbow. It is best to wrap the cuff around bare skin. 6. Fit the cuff snugly around your arm. You should be able to place only one finger between the cuff and your arm. 7. Put the cord inside the groove of your elbow. 8. Press the power button. 9. Sit quietly while the cuff  fills with air and loses air. 10. Write down the numbers on the screen. 11. Wait 2-3 minutes and then repeat steps 1-10. What do the numbers mean? Two numbers make up your blood pressure. The first number is called systolic pressure. The second is called diastolic pressure. An example of a blood pressure reading is "120 over 80" (or 120/80). If you are an adult and do not have a medical condition, use this guide to find out if your blood pressure is normal: Normal  First number: below 120.  Second number: below 80. Elevated  First number: 120-129.  Second number: below 80. Hypertension stage 1  First number: 130-139.  Second number: 80-89. Hypertension stage 2  First number: 140 or above.  Second number: 60 or above. Your blood pressure is above normal even if only the top or bottom number is above normal. Follow these instructions at home:  Check your blood pressure as often as your doctor tells you to.  Take your monitor to your next doctor's appointment. Your doctor will: ? Make sure you are using it correctly. ? Make sure it is working right.  Make sure you understand what your blood pressure numbers should be.  Tell your doctor if your medicines are causing side effects. Contact a doctor if:  Your blood pressure keeps being high. Get help right away if:  Your first blood pressure number is higher than 180.  Your second blood pressure number is higher than 120. This information is not intended to replace advice given to you by your health care provider. Make sure you discuss any questions you have with your health care provider. Document Revised: 08/08/2017 Document Reviewed: 02/02/2016 Elsevier Patient Education  2020 [employer].

## 2020-02-11 ENCOUNTER — Ambulatory Visit
Admission: RE | Admit: 2020-02-11 | Discharge: 2020-02-11 | Disposition: A | Discharge status: Home / Self Care | Payer: Self-pay | Source: Ambulatory Visit | Attending: Cardiovascular Disease | Admitting: Cardiovascular Disease

## 2020-02-11 ENCOUNTER — Other Ambulatory Visit: Payer: Self-pay

## 2020-02-11 DIAGNOSIS — R0789 Other chest pain: Secondary | ICD-10-CM | POA: Insufficient documentation

## 2020-02-21 ENCOUNTER — Telehealth: Payer: Self-pay

## 2020-02-21 NOTE — Telephone Encounter (Signed)
Call to patient to review CT calcium score results.    Pt verbalized understanding and has no further questions at this time.    Advised pt to call for any further questions or concerns.  No further orders.

## 2020-02-21 NOTE — Telephone Encounter (Signed)
-----   Message from Minna Merritts, MD sent at 02/20/2020  6:10 PM EDT ----- Very low calcium score of 22 Good news Places him at very low risk This would suggest recent chest pain likely from musculoskeletal strain

## 2021-02-26 IMAGING — CT CT CARDIAC CORONARY ARTERY CALCIUM SCORE
1 of 3 series · 8 of 20 positions shown, 10 images · non-contrast
Comparison: None.
COMPARISON: None.

Addendum:
EXAM:
OVER-READ INTERPRETATION  CT CHEST

The following report is an over-read performed by radiologist Dr.
Olexjoe Lexus Brunei [REDACTED] on 02/11/2020. This over-read
does not include interpretation of cardiac or coronary anatomy or
pathology. The calcium score interpretation by the cardiologist is
attached.
CLINICAL DATA: Risk stratification
Coronary Calcium Score
TECHNIQUE: The patient was scanned on a Siemens go.Top Scanner. Axial
non-contrast 3 mm slices were carried out through the heart. The
data set was analyzed on a dedicated work station and scored using
the Agatson method.

[Series 2: sa36 calcium scoring 3.00 · axial · 0.38mm/px · z∈[-1106,-1001]mm · 8 of 45 slices shown, 10 images]
[im 5/45  vessel]
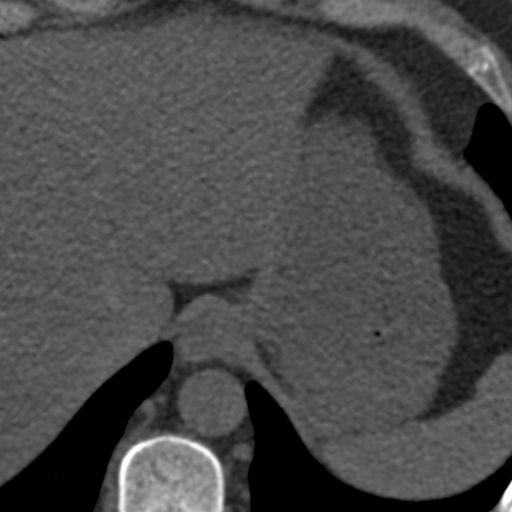
[im 5/45  lung]
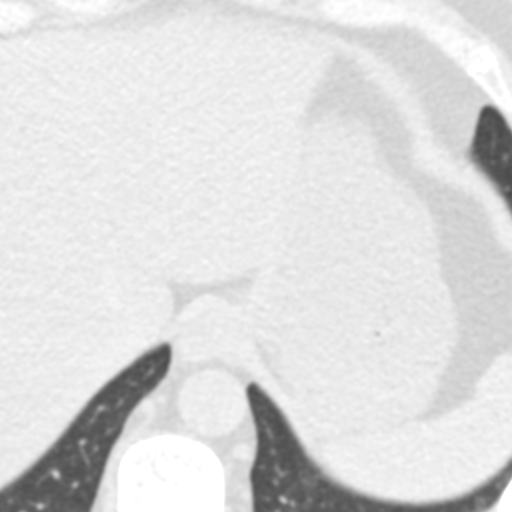
[im 10/45  vessel]
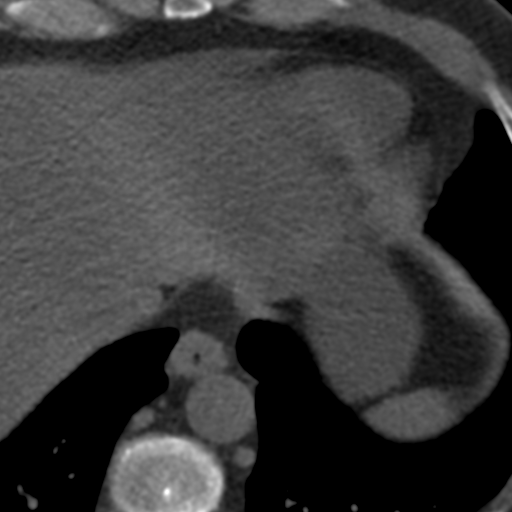
[im 15/45  vessel]
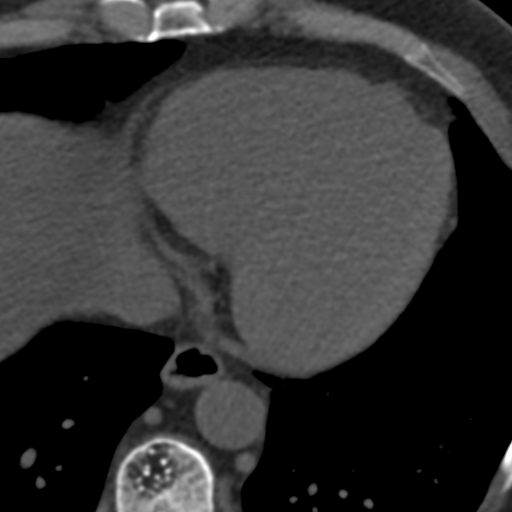
[im 20/45  vessel]
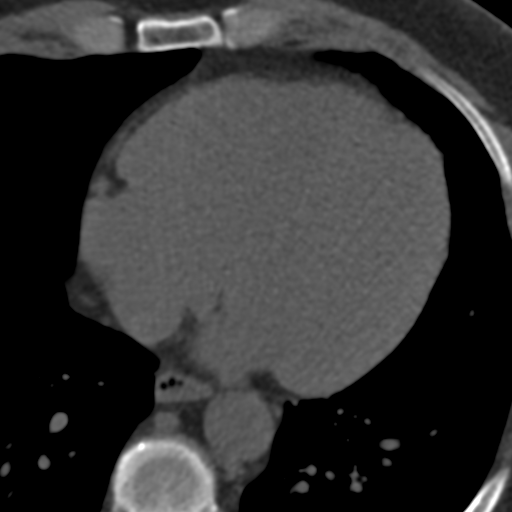
[im 25/45  vessel]
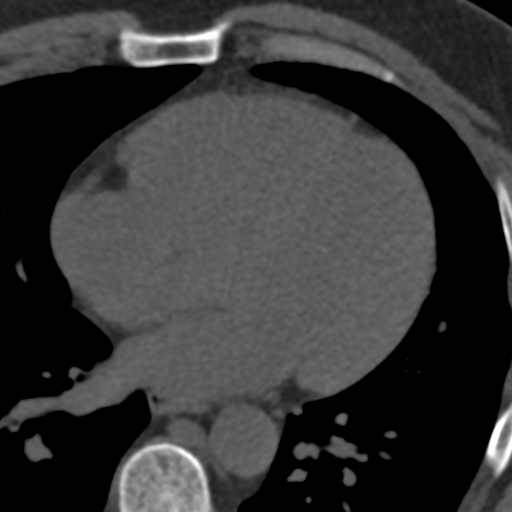
[im 25/45  lung]
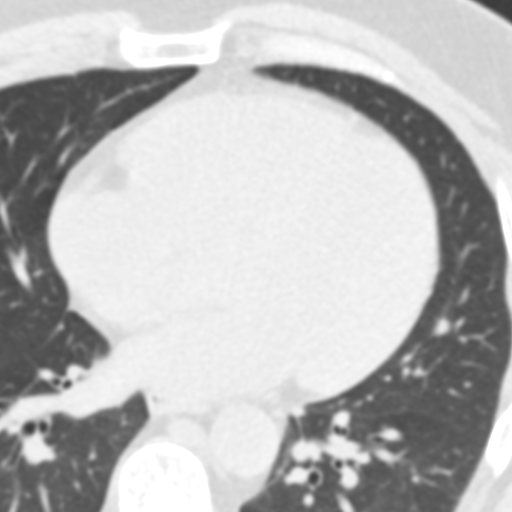
[im 30/45  vessel]
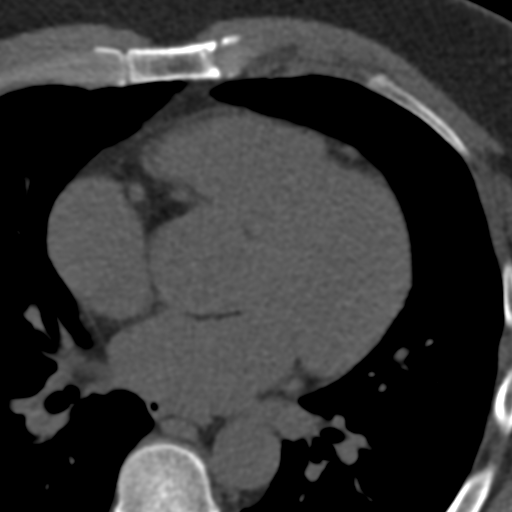
[im 35/45  vessel]
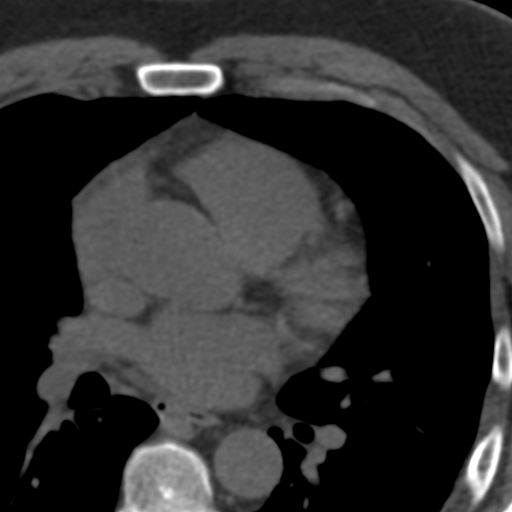
[im 40/45  vessel]
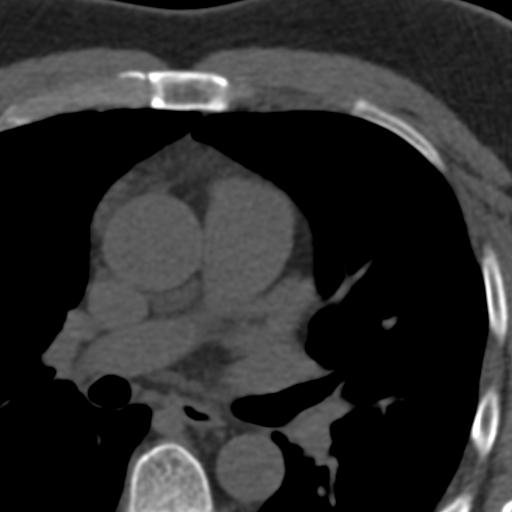

[8 of 20 positions shown; findings below may reference images not displayed]

FINDINGS: Vascular: Normal aortic caliber.

Mediastinum/Nodes: No imaged thoracic adenopathy.

Lungs/Pleura: No pleural fluid.  Clear imaged lungs.

Upper Abdomen: Moderate hepatic steatosis. Normal imaged portions of
the spleen, stomach.

Musculoskeletal: Lower thoracic hemangioma.
IMPRESSION: 1. No acute extracardiac process in the imaged chest.
2. Hepatic steatosis.
FINDINGS: Non-cardiac: See separate report from [REDACTED].

Ascending Aorta: Normal size

Pericardium: Normal

Coronary arteries: Normal origin of right and left coronary arteries
IMPRESSION: 1. Coronary calcium score of 21.5. Patient is not in the range for
calculating/estimating percentile for age and sex matched control.

2.  Low coronary calcium score

*** End of Addendum ***
EXAM:
OVER-READ INTERPRETATION  CT CHEST

The following report is an over-read performed by radiologist Dr.
Olexjoe Lexus Brunei [REDACTED] on 02/11/2020. This over-read
does not include interpretation of cardiac or coronary anatomy or
pathology. The calcium score interpretation by the cardiologist is
attached.
FINDINGS: Vascular: Normal aortic caliber.

Mediastinum/Nodes: No imaged thoracic adenopathy.

Lungs/Pleura: No pleural fluid.  Clear imaged lungs.

Upper Abdomen: Moderate hepatic steatosis. Normal imaged portions of
the spleen, stomach.

Musculoskeletal: Lower thoracic hemangioma.
IMPRESSION: 1. No acute extracardiac process in the imaged chest.
2. Hepatic steatosis.

## 2021-03-08 ENCOUNTER — Other Ambulatory Visit: Payer: Self-pay

## 2021-03-08 NOTE — Telephone Encounter (Signed)
Name of Medication: methocarbamol 500 mg Name of Pharmacy: [facility] or Written Date and Quantity: # 30 x 3 on 01/28/20 Last Office Visit and Type: 01/28/20 HFU Next Office Visit and Type: none scheduled  Pt left v/m requesting refill on methocarbamol; pt has a long trip to drive over the weekend and thinks may need the methocarbamol. Please advise.

## 2021-03-09 MED ORDER — METHOCARBAMOL 500 MG PO TABS: 500.0000 mg | ORAL_TABLET | Freq: Three times a day (TID) | ORAL | 1 refills | Status: DC | PRN

## 2021-07-29 DIAGNOSIS — Z23 Encounter for immunization: Secondary | ICD-10-CM | POA: Diagnosis not present

## 2021-09-12 ENCOUNTER — Ambulatory Visit (INDEPENDENT_AMBULATORY_CARE_PROVIDER_SITE_OTHER): Payer: BC Managed Care – PPO | Admitting: Family Medicine

## 2021-09-12 ENCOUNTER — Encounter: Payer: Self-pay | Admitting: Family Medicine

## 2021-09-12 ENCOUNTER — Other Ambulatory Visit: Payer: Self-pay

## 2021-09-12 VITALS — BP 122/78 | HR 94 | Temp 97.8°F | Ht 72.0 in | Wt 244.0 lb

## 2021-09-12 DIAGNOSIS — K921 Melena: Secondary | ICD-10-CM | POA: Diagnosis not present

## 2021-09-12 LAB — COMPREHENSIVE METABOLIC PANEL
ALT: 31 U/L (ref 0–53)
AST: 20 U/L (ref 0–37)
Albumin: 4.4 g/dL (ref 3.5–5.2)
Alkaline Phosphatase: 52 U/L (ref 39–117)
BUN: 16 mg/dL (ref 6–23)
CO2: 27 mEq/L (ref 19–32)
Calcium: 9.8 mg/dL (ref 8.4–10.5)
Chloride: 102 mEq/L (ref 96–112)
Creatinine, Ser: 1.16 mg/dL (ref 0.40–1.50)
GFR: 77.03 mL/min (ref >60.00)
Glucose, Bld: 106 mg/dL — ABNORMAL HIGH (ref 70–99)
Potassium: 3.9 mEq/L (ref 3.5–5.1)
Sodium: 138 mEq/L (ref 135–145)
Total Bilirubin: 0.6 mg/dL (ref 0.2–1.2)
Total Protein: 7.5 g/dL (ref 6.0–8.3)

## 2021-09-12 LAB — CBC WITH DIFFERENTIAL/PLATELET
Basophils Absolute: 0 10*3/uL (ref 0.0–0.1)
Basophils Relative: 0.6 % (ref 0.0–3.0)
Eosinophils Absolute: 0.2 10*3/uL (ref 0.0–0.7)
Eosinophils Relative: 3.8 % (ref 0.0–5.0)
HCT: 43 % (ref 39.0–52.0)
Hemoglobin: 14.1 g/dL (ref 13.0–17.0)
Lymphocytes Relative: 29.6 % (ref 12.0–46.0)
Lymphs Abs: 1.2 10*3/uL (ref 0.7–4.0)
MCHC: 32.7 g/dL (ref 30.0–36.0)
MCV: 85.2 fl (ref 78.0–100.0)
Monocytes Absolute: 0.5 10*3/uL (ref 0.1–1.0)
Monocytes Relative: 12.2 % — ABNORMAL HIGH (ref 3.0–12.0)
Neutro Abs: 2.3 10*3/uL (ref 1.4–7.7)
Neutrophils Relative %: 53.8 % (ref 43.0–77.0)
Platelets: 203 10*3/uL (ref 150.0–400.0)
RBC: 5.04 Mil/uL (ref 4.22–5.81)
RDW: 13.9 % (ref 11.5–15.5)
WBC: 4.2 10*3/uL (ref 4.0–10.5)

## 2021-09-12 LAB — SEDIMENTATION RATE: Sed Rate: 26 mm/hr — ABNORMAL HIGH (ref 0–15)

## 2021-09-12 NOTE — Progress Notes (Signed)
Patient ID: Jordan Shelton, male    DOB: [birth year], 46 y.o.   MRN: [id number]  This visit was conducted in person.  BP 122/78 (BP Location: Left Arm, Patient Position: Sitting, Cuff Size: Normal)    Pulse 94    Temp 97.8 F (36.6 C) (Temporal)    Ht 6' (1.829 m)    Wt 244 lb (110.7 kg)    SpO2 97%    BMI 33.09 kg/m    CC: blood in stool  Subjective:   HPI: Jordan Shelton is a 46 y.o. male presenting on 09/12/2021 for Acute Visit (GI issues, blood in stool off and on x 6 months)   6 mo h/o noticing bright red blood in stool, no clots. Episodes occur every few months, progressively more blood each time. Bleeding episodes can last several days, most recently over a week.  Notes worse with spicy foods or when constipated. He's been eating more spicy foods over the last few years.  Normally has 3-5 bowel movements/day, loose but formed stool.  Drinks 1-2 cups of coffee/day.   No fevers/chills, unexpected weight loss, abdominal pain, nausea/vomiting.  No oral lesions, new joint pains, skin rash, red eyes.   Some chronic GERD symptoms.   Mother with h/o Crohn's disease.  No colon cancer in the family.   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 Weight gain with more sedentary lifestyle.      Relevant past medical, surgical, family and social history reviewed and updated as indicated. Interim medical history since our last visit reviewed. Allergies and medications reviewed and updated. Outpatient Medications Prior to Visit  Medication Sig Dispense Refill   Coenzyme Q10 (CO Q 10 PO) Take 1 capsule by mouth.     glycopyrrolate (ROBINUL) 1 MG tablet Take 2 tablets (2 mg total) by mouth 2 (two) times daily. As needed for hyperhidrosis     Magnesium 400 MG CAPS Take 1 capsule by mouth daily.     MEGARED OMEGA-3 KRILL OIL 500 MG CAPS Take 500 mg by mouth daily.     methocarbamol (ROBAXIN) 500 MG tablet Take 1 tablet (500 mg total) by mouth 3 (three) times daily as needed for  muscle spasms (sedation precautions). 30 tablet 1   Multiple Vitamin (MULTIVITAMIN) tablet Take 1 tablet by mouth daily.     omeprazole (PRILOSEC) 20 MG capsule Take 1 capsule (20 mg total) by mouth daily as needed (reflux).     Potassium Gluconate 550 MG TABS Take 500 mg by mouth.     No facility-administered medications prior to visit.     Per HPI unless specifically indicated in ROS section below Review of Systems  Objective:  BP 122/78 (BP Location: Left Arm, Patient Position: Sitting, Cuff Size: Normal)    Pulse 94    Temp 97.8 F (36.6 C) (Temporal)    Ht 6' (1.829 m)    Wt 244 lb (110.7 kg)    SpO2 97%    BMI 33.09 kg/m   Wt Readings from Last 3 Encounters:  09/12/21 244 lb (110.7 kg)  02/01/20 246 lb 2 oz (111.6 kg)  01/28/20 246 lb 6 oz (111.8 kg)      Physical Exam Vitals and nursing note reviewed.  Constitutional:      Appearance: Normal appearance. He is not ill-appearing.  Eyes:     Extraocular Movements: Extraocular movements intact.     Pupils: Pupils are equal, round, and reactive to light.  Cardiovascular:  Rate and Rhythm: Normal rate and regular rhythm.     Pulses: Normal pulses.     Heart sounds: Normal heart sounds. No murmur heard. Pulmonary:     Effort: Pulmonary effort is normal. No respiratory distress.     Breath sounds: Normal breath sounds. No wheezing, rhonchi or rales.  Abdominal:     General: Bowel sounds are normal. There is no distension.     Palpations: Abdomen is soft. There is no mass.     Tenderness: There is no abdominal tenderness. There is no guarding or rebound. Negative signs include Murphy's sign.     Hernia: No hernia is present.  Genitourinary:    Rectum: No anal fissure or external hemorrhoid.     Comments: No obvious cause for bleed on external anal exam Musculoskeletal:     Right lower leg: No edema.     Left lower leg: No edema.  Skin:    General: Skin is warm and dry.     Findings: No rash.  Neurological:      Mental Status: He is alert.  Psychiatric:        Mood and Affect: Mood normal.        Behavior: Behavior normal.       Assessment & Plan:  This visit occurred during the SARS-CoV-2 public health emergency.  Safety protocols were in place, including screening questions prior to the visit, additional usage of staff PPE, and extensive cleaning of exam room while observing appropriate contact time as indicated for disinfecting solutions.   Problem List Items Addressed This Visit     Blood in stool, frank - Primary    Several episodes over the past 6 months of frank bright red blood in stool/commode without obvious external hemorrhoid or anal fissure. Discussed Ddx including but not limited to internal hemorrhoid, IBD, cancer.  His mother has Crohn's diagnosis.  Endorses limited water and fiber in diet - encouraged he work on this, rec start soluble fiber supplementation.  Check CBC and CMP today.  Refer to GI for further evaluation.  Pt agrees with plan.        Relevant Orders   Ambulatory referral to Gastroenterology   Comprehensive metabolic panel   CBC with Differential/Platelet   Sedimentation rate     No orders of the defined types were placed in this encounter.  Orders Placed This Encounter  Procedures   Comprehensive metabolic panel   CBC with Differential/Platelet   Sedimentation rate   Ambulatory referral to Gastroenterology    Referral Priority:   Routine    Referral Type:   Consultation    Referral Reason:   Specialty Services Required    Number of Visits Requested:   1     Patient Instructions  Labs today  We will refer you to GI for further evaluation In the meantime, increase water and fiber in the diet. Start taking 1 spoonful of benefiber or metamucil daily - with increased water.   Follow up plan: Return if symptoms worsen or fail to improve.  Ria Bush, MD

## 2021-09-12 NOTE — Assessment & Plan Note (Signed)
Several episodes over the past 6 months of frank bright red blood in stool/commode without obvious external hemorrhoid or anal fissure. Discussed Ddx including but not limited to internal hemorrhoid, IBD, cancer.  His mother has Crohn's diagnosis.  Endorses limited water and fiber in diet - encouraged he work on this, rec start soluble fiber supplementation.  Check CBC and CMP today.  Refer to GI for further evaluation.  Pt agrees with plan.

## 2021-09-12 NOTE — Patient Instructions (Addendum)
Labs today  We will refer you to GI for further evaluation In the meantime, increase water and fiber in the diet. Start taking 1 spoonful of benefiber or metamucil daily - with increased water.

## 2021-09-13 ENCOUNTER — Telehealth: Payer: Self-pay | Admitting: Family Medicine

## 2021-09-13 NOTE — Telephone Encounter (Signed)
Pt called stating that he had a referral to [facility] in [place]. Pt states that they cant see him until 11/01/21 and he is having some bleeding. Pt is asking if there anyone else that he can be referred to that could get him in earlier.Please advise.

## 2021-09-14 ENCOUNTER — Encounter: Payer: Self-pay | Admitting: Gastroenterology

## 2021-09-14 NOTE — Telephone Encounter (Signed)
Should the order be changed?

## 2021-09-14 NOTE — Telephone Encounter (Addendum)
Given stable exam and labs, I think that is ok.  Will send pt a message through mychart.

## 2021-09-14 NOTE — Telephone Encounter (Signed)
All GI offices are backed up  If the referral was placed as Routine - that will delay the scheduling process bc they will schedule first available appts

## 2021-09-18 ENCOUNTER — Ambulatory Visit (INDEPENDENT_AMBULATORY_CARE_PROVIDER_SITE_OTHER): Payer: BC Managed Care – PPO | Admitting: Gastroenterology

## 2021-09-18 ENCOUNTER — Encounter: Payer: Self-pay | Admitting: Gastroenterology

## 2021-09-18 VITALS — BP 136/97 | HR 81 | Temp 99.1°F | Ht 72.0 in | Wt 244.0 lb

## 2021-09-18 DIAGNOSIS — K921 Melena: Secondary | ICD-10-CM | POA: Diagnosis not present

## 2021-09-18 MED ORDER — PEG 3350-KCL-NA BICARB-NACL 420 G PO SOLR: 4000.0000 mL | Freq: Once | ORAL | 0 refills | Status: AC

## 2021-09-18 NOTE — Addendum Note (Signed)
Addended by: Eliseo Squires on: 09/18/2021 03:11 PM   Modules accepted: Orders

## 2021-09-18 NOTE — Progress Notes (Signed)
[name] MD, MRCP(U.K) [address]  [place]  [place], [place] [address]  Main: [phone]  Fax: [phone]   Gastroenterology Consultation  Referring Provider:     Ria Bush, MD Primary Care Physician:  Ria Bush, MD Primary Gastroenterologist:  Dr. [name]  Reason for Consultation:    Blood in stool         HPI:   Jordan Shelton is a 46 y.o. y/o male referred for consultation & management  by Dr. Ria Bush, MD.      He states he is here today to see me for rectal bleeding.  First noticed it in June July 2022 had bleeding going on for a few days where he saw blood on the tissue paper: Toilet bowl and mixed with the stools.  Denies any abdominal pain.  He had 2 other episodes every few months a lot in the last 1 due to Christmas time.  He has been taking Aleve 2 tablets at nighttime for a few years.  His mother was diagnosed with Crohn's disease.  He denies any weight loss.  No family history of colon cancer.  No prior history of endoscopy or colonoscopy.  Past Medical History:  Diagnosis Date   Dysplastic nevus of trunk 03/2014   mod melanocytic atypia, margins free   GERD (gastroesophageal reflux disease)    History of chickenpox     Past Surgical History:  Procedure Laterality Date   abd [place]  06/2011   fatty liver   CYSTECTOMY     benign   MOLE REMOVAL      Prior to Admission medications   Medication Sig Start Date End Date Taking? Authorizing Provider  Coenzyme Q10 (CO Q 10 PO) Take 1 capsule by mouth.    [provider]  glycopyrrolate (ROBINUL) 1 MG tablet Take 2 tablets (2 mg total) by mouth 2 (two) times daily. As needed for hyperhidrosis 01/28/20   Ria Bush, MD  Magnesium 400 MG CAPS Take 1 capsule by mouth daily. 01/28/20   Ria Bush, MD  MEGARED OMEGA-3 KRILL OIL 500 MG CAPS Take 500 mg by mouth daily.    Ria Bush, MD  methocarbamol (ROBAXIN) 500 MG tablet Take 1 tablet (500 mg total) by  mouth 3 (three) times daily as needed for muscle spasms (sedation precautions). 03/09/21   Ria Bush, MD  Multiple Vitamin (MULTIVITAMIN) tablet Take 1 tablet by mouth daily.    [provider]  omeprazole (PRILOSEC) 20 MG capsule Take 1 capsule (20 mg total) by mouth daily as needed (reflux). 01/28/20   Ria Bush, MD  Potassium Gluconate 550 MG TABS Take 500 mg by mouth.    [provider]    Family History  Problem Relation Age of Onset   Prostate cancer Father 57       non agressive   Hypertension Father    Crohn's disease Mother    Lupus Mother    Heart disease Mother        pacemaker   Thyroid disease Mother    Stroke Maternal Grandmother    CAD Maternal Grandfather 14       MI     Social History   Tobacco Use   Smoking status: Former    Types: Cigarettes    Quit date: 09/10/2003    Years since quitting: 18.0   Smokeless tobacco: Never  Substance Use Topics   Alcohol use: Yes    Alcohol/week: 0.0 standard drinks  Comment: Social   Drug use: No    Allergies as of 09/18/2021   (No Known Allergies)    Review of Systems:    All systems reviewed and negative except where noted in HPI.   Physical Exam:  BP (!) 136/97 (BP Location: Left Arm, Patient Position: Sitting, Cuff Size: Large)    Pulse 81    Temp 99.1 F (37.3 C) (Oral)    Ht 6' (1.829 m)    Wt 244 lb (110.7 kg)    BMI 33.09 kg/m  No LMP for male patient. Psych:  Alert and cooperative. Normal mood and affect. General:   Alert,  Well-developed, well-nourished, pleasant and cooperative in NAD Head:  Normocephalic and atraumatic. Eyes:  Sclera clear, no icterus.   Conjunctiva pink. Ears:  Normal auditory acuity. Lungs:  Respirations even and unlabored.  Clear throughout to auscultation.   No wheezes, crackles, or rhonchi. No acute distress. Heart:  Regular rate and rhythm; no murmurs, clicks, rubs, or gallops. Abdomen:  Normal bowel sounds.  No bruits.  Soft, non-tender and  non-distended without masses, hepatosplenomegaly or hernias noted.  No guarding or rebound tenderness.    Neurologic:  Alert and oriented x3;  grossly normal neurologically. Psych:  Alert and cooperative. Normal mood and affect.  Imaging Studies: No results found.  Assessment and Plan:   Jordan Shelton is a 46 y.o. y/o male has been referred for blood in stool.  Differentials include NSAID related colitis versus inflammatory bowel disease.  Plan 1.  Stop all NSAID use. 2.  Diagnostic colonoscopy. 3.  Follow-up in my office , if bleeding is felt to be due to hemorrhoids to consider banding due to significance of the amount of bleeding that he has had   I have discussed alternative options, risks & benefits,  which include, but are not limited to, bleeding, infection, perforation,respiratory complication & drug reaction.  The patient agrees with this plan & written consent will be obtained.    Follow up in 6 weeks  Dr [name] MD,MRCP(U.K)

## 2021-11-01 ENCOUNTER — Ambulatory Visit: Payer: Managed Care, Other (non HMO) | Admitting: Gastroenterology

## 2021-11-05 ENCOUNTER — Encounter: Payer: Self-pay | Admitting: Gastroenterology

## 2021-11-05 NOTE — Anesthesia Preprocedure Evaluation (Addendum)
Anesthesia Evaluation  Patient identified by MRN, date of birth, ID band Patient awake    Reviewed: Allergy & Precautions, NPO status , Patient's Chart, lab work & pertinent test results  Airway Mallampati: III  TM Distance: >3 FB Neck ROM: full    Dental no notable dental hx.    Pulmonary neg pulmonary ROS, former smoker,    Pulmonary exam normal        Cardiovascular negative cardio ROS Normal cardiovascular exam     Neuro/Psych negative neurological ROS  negative psych ROS   GI/Hepatic Neg liver ROS, GERD  Controlled and Medicated,  Endo/Other  negative endocrine ROS  Renal/GU negative Renal ROS  negative genitourinary   Musculoskeletal   Abdominal (+) + obese,   Peds  Hematology negative hematology ROS (+)   Anesthesia Other Findings Past Medical History: 03/2014: Dysplastic nevus of trunk     Comment:  mod melanocytic atypia, margins free No date: GERD (gastroesophageal reflux disease) No date: History of chickenpox  Past Surgical History: 06/2011: abd [place]     Comment:  fatty liver No date: CYSTECTOMY     Comment:  benign No date: MOLE REMOVAL     Reproductive/Obstetrics negative OB ROS                           Anesthesia Physical Anesthesia Plan  ASA: 2  Anesthesia Plan: General   Post-op Pain Management:    Induction: Intravenous  PONV Risk Score and Plan: Propofol infusion and TIVA  Airway Management Planned: Natural Airway and Nasal Cannula  Additional Equipment:   Intra-op Plan:   Post-operative Plan:   Informed Consent: I have reviewed the patients History and Physical, chart, labs and discussed the procedure including the risks, benefits and alternatives for the proposed anesthesia with the patient or authorized representative who has indicated his/her understanding and acceptance.     Dental advisory given  Plan Discussed with: Anesthesiologist, CRNA  and Surgeon  Anesthesia Plan Comments:        Anesthesia Quick Evaluation

## 2021-11-06 ENCOUNTER — Encounter
Admission: RE | Disposition: A | Discharge status: Home / Self Care | Payer: Self-pay | Source: Home / Self Care | Attending: Gastroenterology

## 2021-11-06 ENCOUNTER — Encounter: Payer: Self-pay | Admitting: Gastroenterology

## 2021-11-06 ENCOUNTER — Ambulatory Visit: Payer: BC Managed Care – PPO | Admitting: Anesthesiology

## 2021-11-06 ENCOUNTER — Ambulatory Visit
Admission: RE | Admit: 2021-11-06 | Discharge: 2021-11-06 | Disposition: A | Discharge status: Home / Self Care | Payer: BC Managed Care – PPO | Attending: Gastroenterology | Admitting: Gastroenterology

## 2021-11-06 DIAGNOSIS — K921 Melena: Secondary | ICD-10-CM

## 2021-11-06 DIAGNOSIS — Z1211 Encounter for screening for malignant neoplasm of colon: Secondary | ICD-10-CM | POA: Diagnosis not present

## 2021-11-06 DIAGNOSIS — K621 Rectal polyp: Secondary | ICD-10-CM | POA: Diagnosis not present

## 2021-11-06 DIAGNOSIS — K635 Polyp of colon: Secondary | ICD-10-CM | POA: Diagnosis not present

## 2021-11-06 DIAGNOSIS — K640 First degree hemorrhoids: Secondary | ICD-10-CM | POA: Diagnosis not present

## 2021-11-06 DIAGNOSIS — K625 Hemorrhage of anus and rectum: Secondary | ICD-10-CM | POA: Insufficient documentation

## 2021-11-06 DIAGNOSIS — D128 Benign neoplasm of rectum: Secondary | ICD-10-CM | POA: Diagnosis not present

## 2021-11-06 DIAGNOSIS — K5730 Diverticulosis of large intestine without perforation or abscess without bleeding: Secondary | ICD-10-CM | POA: Insufficient documentation

## 2021-11-06 DIAGNOSIS — Z87891 Personal history of nicotine dependence: Secondary | ICD-10-CM | POA: Diagnosis not present

## 2021-11-06 DIAGNOSIS — K219 Gastro-esophageal reflux disease without esophagitis: Secondary | ICD-10-CM | POA: Insufficient documentation

## 2021-11-06 DIAGNOSIS — K649 Unspecified hemorrhoids: Secondary | ICD-10-CM | POA: Diagnosis not present

## 2021-11-06 HISTORY — PX: COLONOSCOPY WITH PROPOFOL: SHX5780

## 2021-11-06 SURGERY — COLONOSCOPY WITH PROPOFOL
Anesthesia: General

## 2021-11-06 MED ORDER — FENTANYL CITRATE (PF) 100 MCG/2ML IJ SOLN
INTRAMUSCULAR | Status: DC | PRN
Start: 2021-11-06 — End: 2021-11-06
  Administered 2021-11-06: 50 ug via INTRAVENOUS

## 2021-11-06 MED ORDER — SODIUM CHLORIDE 0.9 % IV SOLN
INTRAVENOUS | Status: DC
  Administered 2021-11-06: 1000 mL via INTRAVENOUS

## 2021-11-06 MED ORDER — PROPOFOL 10 MG/ML IV BOLUS
INTRAVENOUS | Status: DC | PRN
  Administered 2021-11-06: 20 mg via INTRAVENOUS
  Administered 2021-11-06: 30 mg via INTRAVENOUS
  Administered 2021-11-06: 80 mg via INTRAVENOUS
  Administered 2021-11-06 (×2): 20 mg via INTRAVENOUS

## 2021-11-06 MED ORDER — PROPOFOL 500 MG/50ML IV EMUL
INTRAVENOUS | Status: AC
  Filled 2021-11-06: qty 50

## 2021-11-06 MED ORDER — DEXMEDETOMIDINE (PRECEDEX) IN NS 20 MCG/5ML (4 MCG/ML) IV SYRINGE
PREFILLED_SYRINGE | INTRAVENOUS | Status: DC | PRN
  Administered 2021-11-06 (×2): 8 ug via INTRAVENOUS
  Administered 2021-11-06: 4 ug via INTRAVENOUS

## 2021-11-06 MED ORDER — LIDOCAINE HCL (CARDIAC) PF 100 MG/5ML IV SOSY
PREFILLED_SYRINGE | INTRAVENOUS | Status: DC | PRN
  Administered 2021-11-06: 50 mg via INTRAVENOUS

## 2021-11-06 MED ORDER — FENTANYL CITRATE (PF) 100 MCG/2ML IJ SOLN
INTRAMUSCULAR | Status: AC
  Filled 2021-11-06: qty 2

## 2021-11-06 MED ORDER — PROPOFOL 500 MG/50ML IV EMUL
INTRAVENOUS | Status: DC | PRN
  Administered 2021-11-06: 200 ug/kg/min via INTRAVENOUS

## 2021-11-06 NOTE — Anesthesia Postprocedure Evaluation (Signed)
Anesthesia Post Note  Patient: Jordan Shelton  Procedure(s) Performed: COLONOSCOPY WITH PROPOFOL  Patient location during evaluation: Endoscopy Anesthesia Type: General Level of consciousness: awake and alert Pain management: pain level controlled Vital Signs Assessment: post-procedure vital signs reviewed and stable Respiratory status: spontaneous breathing, nonlabored ventilation and respiratory function stable Cardiovascular status: blood pressure returned to baseline and stable Postop Assessment: no apparent nausea or vomiting Anesthetic complications: no   No notable events documented.   Last Vitals:  Vitals:   11/06/21 0951 11/06/21 1001  BP: 113/81 (!) 125/96  Pulse: 81 76  Resp: (!) 21 18  Temp:    SpO2: 97% 96%    Last Pain:  Vitals:   11/06/21 1001  TempSrc:   PainSc: 0-No pain                 Iran Ouch

## 2021-11-06 NOTE — Anesthesia Procedure Notes (Signed)
Procedure Name: MAC Date/Time: 11/06/2021 9:23 AM Performed by: Biagio Borg, CRNA Pre-anesthesia Checklist: Patient identified, Emergency Drugs available, Suction available, Patient being monitored and Timeout performed Patient Re-evaluated:Patient Re-evaluated prior to induction Oxygen Delivery Method: Nasal cannula Induction Type: IV induction Placement Confirmation: positive ETCO2 and CO2 detector

## 2021-11-06 NOTE — H&P (Signed)
Jordan Bellows, MD [address], [place], [place], [place], [address] [address], [place] [facility] [place], [place], [place], [address] Phone: [phone]  Fax: [phone]  Primary Care Physician:  Ria Bush, MD   Pre-Procedure History & Physical: HPI:  Jordan Shelton is a 46 y.o. male is here for an colonoscopy.   Past Medical History:  Diagnosis Date   Dysplastic nevus of trunk 03/2014   mod melanocytic atypia, margins free   GERD (gastroesophageal reflux disease)    History of chickenpox     Past Surgical History:  Procedure Laterality Date   abd [place]  06/2011   fatty liver   CYSTECTOMY     benign   MOLE REMOVAL      Prior to Admission medications   Medication Sig Start Date End Date Taking? Authorizing Provider  Coenzyme Q10 (CO Q 10 PO) Take 1 capsule by mouth.   Yes [provider]  glycopyrrolate (ROBINUL) 1 MG tablet Take 2 tablets (2 mg total) by mouth 2 (two) times daily. As needed for hyperhidrosis 01/28/20  Yes Ria Bush, MD  Magnesium 400 MG CAPS Take 1 capsule by mouth daily. 01/28/20  Yes Ria Bush, MD  MEGARED OMEGA-3 KRILL OIL 500 MG CAPS Take 500 mg by mouth daily.   Yes Ria Bush, MD  methocarbamol (ROBAXIN) 500 MG tablet Take 1 tablet (500 mg total) by mouth 3 (three) times daily as needed for muscle spasms (sedation precautions). 03/09/21  Yes Ria Bush, MD  Multiple Vitamin (MULTIVITAMIN) tablet Take 1 tablet by mouth daily.   Yes [provider]  omeprazole (PRILOSEC) 20 MG capsule Take 1 capsule (20 mg total) by mouth daily as needed (reflux). 01/28/20  Yes Ria Bush, MD  Potassium Gluconate 550 MG TABS Take 500 mg by mouth.   Yes [provider]    Allergies as of 09/18/2021   (No Known Allergies)    Family History  Problem Relation Age of Onset   Prostate cancer Father 29       non agressive   Hypertension Father    Crohn's disease Mother    Lupus Mother    Heart disease Mother         pacemaker   Thyroid disease Mother    Stroke Maternal Grandmother    CAD Maternal Grandfather 33       MI    Social History   Socioeconomic History   Marital status: Single    Spouse name: Not on file   Number of children: Not on file   Years of education: Not on file   Highest education level: Not on file  Occupational History   Occupation: Scientist, research (medical)    Comment: [employer]  Tobacco Use   Smoking status: Former    Types: Cigarettes    Quit date: 09/10/2003    Years since quitting: 18.1   Smokeless tobacco: Never  Vaping Use   Vaping Use: Never used  Substance and Sexual Activity   Alcohol use: Yes    Alcohol/week: 0.0 standard drinks    Comment: Social   Drug use: No   Sexual activity: Not on file  Other Topics Concern   Not on file  Social History Narrative   Lives with wife and 2 daughters   Occupation: First Engineer, 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   Social Determinants of Health  Financial Resource Strain: Not on file  Food Insecurity: Not on file  Transportation Needs: Not on file  Physical Activity: Not on file  Stress: Not on file  Social Connections: Not on file  Intimate Partner Violence: Not on file    Review of Systems: See HPI, otherwise negative ROS  Physical Exam: BP (!) 151/96    Pulse 70    Temp (!) 96.8 F (36 C) (Temporal)    Resp 18    Ht 6' (1.829 m)    Wt 109.1 kg    SpO2 100%    BMI 32.62 kg/m  General:   Alert,  pleasant and cooperative in NAD Head:  Normocephalic and atraumatic. Neck:  Supple; no masses or thyromegaly. Lungs:  Clear throughout to auscultation, normal respiratory effort.    Heart:  +S1, +S2, Regular rate and rhythm, No edema. Abdomen:  Soft, nontender and nondistended. Normal bowel sounds, without guarding, and without rebound.   Neurologic:  Alert and  oriented x4;  grossly normal  neurologically.  Impression/Plan: Jordan Shelton is here for an colonoscopy to be performed for rectal bleeding .Risks, benefits, limitations, and alternatives regarding  colonoscopy have been reviewed with the patient.  Questions have been answered.  All parties agreeable.   Jordan Bellows, MD  11/06/2021, 9:02 AM

## 2021-11-06 NOTE — Op Note (Signed)
[facility] Gastroenterology Patient Name: Jordan Shelton Procedure Date: 11/06/2021 9:15 AM MRN: [id number] Account #: [id number] Date of Birth: November 21, 1975 Admit Type: Outpatient Age: 46 Room: [facility] ENDO ROOM 2 Gender: Male Note Status: Finalized Instrument Name: Jasper Riling [id number] Procedure:             Colonoscopy Indications:           Rectal bleeding Providers:             [name] MD, MD Referring MD:          Ria Bush (Referring MD) Medicines:             Monitored Anesthesia Care Complications:         No immediate complications. Procedure:             Pre-Anesthesia Assessment:                        - Prior to the procedure, a History and Physical was                         performed, and patient medications, allergies and                         sensitivities were reviewed. The patient's tolerance                         of previous anesthesia was reviewed.                        - The risks and benefits of the procedure and the                         sedation options and risks were discussed with the                         patient. All questions were answered and informed                         consent was obtained.                        - ASA Grade Assessment: II - A patient with mild                         systemic disease.                        After obtaining informed consent, the colonoscope was                         passed under direct vision. Throughout the procedure,                         the patient's blood pressure, pulse, and oxygen                         saturations were monitored continuously. The                         Colonoscope was introduced through the anus and  advanced to the the cecum, identified by the                         appendiceal orifice. The colonoscopy was performed                         with ease. The patient tolerated the procedure well.                         The quality  of the bowel preparation was good. Findings:      The perianal and digital rectal examinations were normal.      Multiple small-mouthed diverticula were found in the left colon.      A 3 mm polyp was found in the rectum. The polyp was sessile. The polyp       was removed with a jumbo cold forceps. Resection and retrieval were       complete.      Non-bleeding internal hemorrhoids were found during retroflexion. The       hemorrhoids were medium-sized and Grade I (internal hemorrhoids that do       not prolapse).      The exam was otherwise without abnormality on direct and retroflexion       views. Impression:            - Diverticulosis in the left colon.                        - One 3 mm polyp in the rectum, removed with a jumbo                         cold forceps. Resected and retrieved.                        - Non-bleeding internal hemorrhoids.                        - The examination was otherwise normal on direct and                         retroflexion views. Recommendation:        - Discharge patient to home (with escort).                        - Resume previous diet.                        - Continue present medications.                        - Await pathology results.                        - Repeat colonoscopy for surveillance based on                         pathology results. Procedure Code(s):     --- Professional ---                        [phone], Colonoscopy, flexible; with biopsy, single or  multiple Diagnosis Code(s):     --- Professional ---                        K64.0, First degree hemorrhoids                        K62.1, Rectal polyp                        K62.5, Hemorrhage of anus and rectum                        K57.30, Diverticulosis of large intestine without                         perforation or abscess without bleeding CPT copyright 2019 American Medical Association. All rights reserved. The codes documented in this report are  preliminary and upon coder review may  be revised to meet current compliance requirements. [name], MD [name] MD, MD 11/06/2021 9:40:18 AM This report has been signed electronically. Number of Addenda: 0 Note Initiated On: 11/06/2021 9:15 AM Scope Withdrawal Time: 0 hours 13 minutes 22 seconds  Total Procedure Duration: 0 hours 15 minutes 38 seconds  Estimated Blood Loss:  Estimated blood loss: none.      [facility]

## 2021-11-06 NOTE — Transfer of Care (Signed)
Immediate Anesthesia Transfer of Care Note  Patient: Jordan Shelton  Procedure(s) Performed: COLONOSCOPY WITH PROPOFOL  Patient Location: PACU and Endoscopy Unit  Anesthesia Type:General  Level of Consciousness: drowsy  Airway & Oxygen Therapy: Patient Spontanous Breathing  Post-op Assessment: Report given to RN and Post -op Vital signs reviewed and stable  Post vital signs: Reviewed and stable  Last Vitals:  Vitals Value Taken Time  BP 117/76 11/06/21 0941  Temp 36.1 C 11/06/21 0941  Pulse 80 11/06/21 0944  Resp 18 11/06/21 0944  SpO2 96 % 11/06/21 0944  Vitals shown include unvalidated device data.  Last Pain:  Vitals:   11/06/21 0941  TempSrc: Temporal  PainSc: Asleep         Complications: No notable events documented.

## 2021-11-07 ENCOUNTER — Encounter: Payer: Self-pay | Admitting: Gastroenterology

## 2021-11-07 LAB — SURGICAL PATHOLOGY

## 2021-12-03 ENCOUNTER — Other Ambulatory Visit: Payer: Self-pay

## 2021-12-03 ENCOUNTER — Ambulatory Visit (INDEPENDENT_AMBULATORY_CARE_PROVIDER_SITE_OTHER): Payer: BC Managed Care – PPO | Admitting: Gastroenterology

## 2021-12-03 ENCOUNTER — Encounter: Payer: Self-pay | Admitting: Gastroenterology

## 2021-12-03 VITALS — BP 148/88 | HR 69 | Temp 98.8°F | Ht 72.0 in | Wt 239.4 lb

## 2021-12-03 DIAGNOSIS — K921 Melena: Secondary | ICD-10-CM | POA: Diagnosis not present

## 2021-12-03 NOTE — Progress Notes (Signed)
?  ?[name] MD, MRCP(U.K) ?[address]  ?[address]  ?[place],  [address]  ?Main: [phone]  ?Fax: [phone] ? ? ?Primary Care Physician: Ria Bush, MD ? ?Primary Gastroenterologist:  Dr. [name]  ? ?Chief Complaint  ?Patient presents with  ? Blood In Stools  ? ? ?HPI: Jordan Shelton is a 46 y.o. male ? ? ?Summary of history : ? ?He was seen and evaluated on 09/18/2021 for blood in the stools he had been on chronic NSAIDs.  Mother was diagnosed with Crohn's disease.  He had seen blood on tissue paper.  And I performed a colonoscopy on 11/06/2021 and was found to have internal hemorrhoids medium in size and a 3 mm polyp was resected in the rectum.  No evidence of colitis was noted.The polyp was hyperplastic. ? ?Interval history 09/18/2021-12/03/2021 ? ?Despite conservative management continues to have blood during a bowel movement.  On and off.  Would like to proceed with hemorrhoidal banding. ? ?PROCEDURE NOTE: ?The patient presents with symptomatic grade 2 hemorrhoids, unresponsive to maximal medical therapy, requesting rubber band ligation of his/her hemorrhoidal disease.  All risks, benefits and alternative forms of therapy were described and informed consent was obtained. ? ?In the Left Lateral Decubitus position (if anoscopy is performed) anoscopic examination revealed grade 2 hemorrhoids in the all position(s).  ? ?The decision was made to band the LL internal hemorrhoid, and the [place] O?Regan System was used to perform band ligation without complication.  Digital anorectal examination was then performed to assure proper positioning of the band, and to adjust the banded tissue as required.  The patient was discharged home without pain or other issues.  Dietary and behavioral recommendations were given and (if necessary - prescriptions were given), along with follow-up instructions.  The patient will return 4 weeks for follow-up and possible additional banding as required. ? ?No  complications were encountered and the patient tolerated the procedure well. ? ? ? ?Current Outpatient Medications  ?Medication Sig Dispense Refill  ? Coenzyme Q10 (CO Q 10 PO) Take 1 capsule by mouth.    ? glycopyrrolate (ROBINUL) 1 MG tablet Take 2 tablets (2 mg total) by mouth 2 (two) times daily. As needed for hyperhidrosis    ? Magnesium 400 MG CAPS Take 1 capsule by mouth daily.    ? MEGARED OMEGA-3 KRILL OIL 500 MG CAPS Take 500 mg by mouth daily.    ? Multiple Vitamin (MULTIVITAMIN) tablet Take 1 tablet by mouth daily.    ? Potassium Gluconate 550 MG TABS Take 500 mg by mouth.    ? methocarbamol (ROBAXIN) 500 MG tablet Take 1 tablet (500 mg total) by mouth 3 (three) times daily as needed for muscle spasms (sedation precautions). (Patient not taking: Reported on 12/03/2021) 30 tablet 1  ? omeprazole (PRILOSEC) 20 MG capsule Take 1 capsule (20 mg total) by mouth daily as needed (reflux). (Patient not taking: Reported on 12/03/2021)    ? ?No current facility-administered medications for this visit.  ? ? ?Allergies as of 12/03/2021  ? (No Known Allergies)  ? ? ?ROS: ? ?General: Negative for anorexia, weight loss, fever, chills, fatigue, weakness. ?ENT: Negative for hoarseness, difficulty swallowing , nasal congestion. ?CV: Negative for chest pain, angina, palpitations, dyspnea on exertion, peripheral edema.  ?Respiratory: Negative for dyspnea at rest, dyspnea on exertion, cough, sputum, wheezing.  ?GI: See history of present illness. ?GU:  Negative for dysuria, hematuria, urinary incontinence, urinary frequency, nocturnal urination.  ?Endo: Negative for unusual weight  change.  ?  ?Physical Examination: ? ? BP (!) 148/88   Pulse 69   Temp 98.8 ?F (37.1 ?C) (Oral)   Ht 6' (1.829 m)   Wt 239 lb 6.4 oz (108.6 kg)   BMI 32.47 kg/m?  ? ?General: Well-nourished, well-developed in no acute distress.  ?Eyes: No icterus. Conjunctivae pink. ?Mouth: Oropharyngeal mucosa moist and pink , no lesions erythema or  exudate. ?Psych: Alert and cooperative, normal mood and affect. ? ? ?Imaging Studies: ?No results found. ? ?Assessment and Plan:  ? ?Jordan Shelton is a 46 y.o. y/o male here to follow-up for blood in the stool likely secondary to internal hemorrhoids seen on his recent colonoscopy.  Today I banded his left lateral column of internal hemorrhoids and he will return in 4 weeks  ? ? ? ?Dr [name]  MD,MRCP [facility]) ?Follow up in 4 weeks ?

## 2022-01-03 ENCOUNTER — Encounter: Payer: Self-pay | Admitting: Gastroenterology

## 2022-01-03 ENCOUNTER — Ambulatory Visit (INDEPENDENT_AMBULATORY_CARE_PROVIDER_SITE_OTHER): Payer: BC Managed Care – PPO | Admitting: Gastroenterology

## 2022-01-03 VITALS — BP 141/104 | HR 86 | Temp 98.9°F | Wt 245.0 lb

## 2022-01-03 DIAGNOSIS — K648 Other hemorrhoids: Secondary | ICD-10-CM

## 2022-01-03 NOTE — Progress Notes (Signed)
Patient follow-ups today for banding of hemorrhoids ? ? ? ?Summary of history : ?He was seen and evaluated on 09/18/2021 for blood in the stools he had been on chronic NSAIDs.  Mother was diagnosed with Crohn's disease.  He had seen blood on tissue paper.  And I performed a colonoscopy on 11/06/2021 and was found to have internal hemorrhoids medium in size and a 3 mm polyp was resected in the rectum.  No evidence of colitis was noted.The polyp was hyperplastic. ?Despite conservative management continued to have blood during a bowel movement ? ?First round:12/03/2021: LL column banded  ? ? ? ?Interval history   12/03/2021-01/03/2022 ?Significant decrease in the quantity of blood after a bowel movement.  Feels much better. ? ? ?Digital rectal exam performed in the presence of a chaperone. ?External anal findings: Prolapsing hemorrhoid which goes back in. ?Internal findings: , No masses, no blood on glove noticed. ? ? ? ?PROCEDURE NOTE: ?The patient presents with symptomatic grade 2 hemorrhoids, unresponsive to maximal medical therapy, requesting rubber band ligation of his/her hemorrhoidal disease.  All risks, benefits and alternative forms of therapy were described and informed consent was obtained. ? ?In the Left Lateral Decubitus position (if anoscopy is performed) anoscopic examination revealed grade 2 hemorrhoids in the RA and RP position(s).  ? ?The decision was made to band the RA internal hemorrhoid, and the [place] O?Regan System was used to perform band ligation without complication.  Digital anorectal examination was then performed to assure proper positioning of the band, and to adjust the banded tissue as required.  The patient was discharged home without pain or other issues.  Dietary and behavioral recommendations were given and (if necessary - prescriptions were given), along with follow-up instructions.  The patient will return 4 weeks for follow-up and possible additional banding as required. ? ?No  complications were encountered and the patient tolerated the procedure well. ? ? ?Plan: ? ?Avoid constipation.  Commence on stool softeners if not already on ? ?Follow-up:4 weeks ? ?Dr [name] MD,MRCP [facility]) ?Gastroenterology/Hepatology ?Pager: [phone] ?  ?

## 2022-01-28 ENCOUNTER — Ambulatory Visit (INDEPENDENT_AMBULATORY_CARE_PROVIDER_SITE_OTHER): Payer: BC Managed Care – PPO | Admitting: Gastroenterology

## 2022-01-28 ENCOUNTER — Encounter: Payer: Self-pay | Admitting: Gastroenterology

## 2022-01-28 VITALS — BP 134/86 | HR 66 | Temp 98.3°F | Wt 242.6 lb

## 2022-01-28 DIAGNOSIS — K648 Other hemorrhoids: Secondary | ICD-10-CM

## 2022-01-28 NOTE — Progress Notes (Signed)
Patient follow-ups today for banding of hemorrhoids    Summary of history :  Summary of history : He was seen and evaluated on 09/18/2021 for blood in the stools he had been on chronic NSAIDs.  Mother was diagnosed with Crohn's disease.  He had seen blood on tissue paper.  And I performed a colonoscopy on 11/06/2021 and was found to have internal hemorrhoids medium in size and a 3 mm polyp was resected in the rectum.  No evidence of colitis was noted.The polyp was hyperplastic. Despite conservative management continued to have blood during a bowel movement   First round:12/03/2021: LL column banded  Second round: 01/03/2022: Right anterior column banded   Interval history  01/03/2022-5.22.2023   Did well after the last banding session no rectal bleeding no other complaints  Digital rectal exam performed in the presence of a chaperone. External anal findings: Prolapsing second-degree hemorrhoid reducible the right posterior position Internal findings: , No masses, no blood on glove noticed.    PROCEDURE NOTE: The patient presents with symptomatic grade 2 hemorrhoids, unresponsive to maximal medical therapy, requesting rubber band ligation of his/her hemorrhoidal disease.  All risks, benefits and alternative forms of therapy were described and informed consent was obtained.  In the Left Lateral Decubitus position (if anoscopy is performed) anoscopic examination revealed grade 2 hemorrhoids in the RP position(s).   The decision was made to band the RP internal hemorrhoid, and the [place] was used to perform band ligation without complication.  Digital anorectal examination was then performed to assure proper positioning of the band, and to adjust the banded tissue as required.  The patient was discharged home without pain or other issues.  Dietary and behavioral recommendations were given and (if necessary - prescriptions were given), along with follow-up instructions.  The patient  will return     as needed for follow-up and possible additional banding as required.  No complications were encountered and the patient tolerated the procedure well.   Plan:  Avoid constipation.  Commence on stool softeners if not already on  Follow-up:as needed  Dr [name] MD,MRCP [facility]) Gastroenterology/Hepatology Pager: [phone]

## 2022-01-28 NOTE — Patient Instructions (Signed)
We will contact you in 10 years for your colonoscopy.

## 2022-08-12 ENCOUNTER — Ambulatory Visit: Payer: BC Managed Care – PPO | Admitting: Dermatology

## 2022-09-18 DIAGNOSIS — L400 Psoriasis vulgaris: Secondary | ICD-10-CM | POA: Diagnosis not present

## 2022-11-06 ENCOUNTER — Other Ambulatory Visit: Payer: Self-pay | Admitting: Family Medicine

## 2022-11-06 NOTE — Telephone Encounter (Signed)
Prescription Request  11/06/2022   LOV: Visit date not found  What is the name of the medication or equipment? methocarbamol (ROBAXIN) 500 MG tablet   Have you contacted your pharmacy to request a refill? No   Which pharmacy would you like this sent to?  [id number] [place] [facility] [address] [place] [address]Phone: [phone]: [phone]   Patient notified that their request is being sent to the clinical staff for review and that they should receive a response within 2 business days.   Please advise at Mobile [phone](mobile)

## 2022-11-06 NOTE — Telephone Encounter (Addendum)
Pt has not seen Dr. Darnell Level in over 1 yr and last CPE was 09/2015.  Needs OV for refill.

## 2022-11-06 NOTE — Addendum Note (Signed)
Addended by: Brenton Grills on: [id number] PM   Modules accepted: Orders

## 2022-11-07 NOTE — Telephone Encounter (Signed)
Patient has been scheduled

## 2022-11-07 NOTE — Telephone Encounter (Signed)
Noted  

## 2022-11-13 ENCOUNTER — Encounter: Payer: Self-pay | Admitting: Family Medicine

## 2022-11-13 ENCOUNTER — Ambulatory Visit (INDEPENDENT_AMBULATORY_CARE_PROVIDER_SITE_OTHER): Payer: BC Managed Care – PPO | Admitting: Family Medicine

## 2022-11-13 VITALS — BP 128/78 | HR 86 | Temp 97.8°F | Ht 72.0 in | Wt 241.1 lb

## 2022-11-13 DIAGNOSIS — K219 Gastro-esophageal reflux disease without esophagitis: Secondary | ICD-10-CM | POA: Diagnosis not present

## 2022-11-13 DIAGNOSIS — M791 Myalgia, unspecified site: Secondary | ICD-10-CM

## 2022-11-13 DIAGNOSIS — E669 Obesity, unspecified: Secondary | ICD-10-CM | POA: Diagnosis not present

## 2022-11-13 MED ORDER — BENZONATATE 100 MG PO CAPS: 100.0000 mg | ORAL_CAPSULE | Freq: Three times a day (TID) | ORAL | 1 refills | Status: DC | PRN

## 2022-11-13 MED ORDER — METHOCARBAMOL 500 MG PO TABS: 500.0000 mg | ORAL_TABLET | Freq: Three times a day (TID) | ORAL | 2 refills | Status: DC | PRN

## 2022-11-13 NOTE — Assessment & Plan Note (Signed)
Notes intermittent muscle aches, finds robaxin helps with this. Requests refill which is reasonable.

## 2022-11-13 NOTE — Patient Instructions (Signed)
Return over summer for physical, prior for fasting labs.  Work on walking routine.  Robaxin refilled as well as tessalon perls to use as needed. Caution muscle relaxant can make you sleepy.

## 2022-11-13 NOTE — Progress Notes (Signed)
Patient ID: Jordan Shelton, male    DOB: 1975/12/31, 47 y.o.   MRN: [id number]  This visit was conducted in person.  BP 128/78   Pulse 86   Temp 97.8 F (36.6 C) (Temporal)   Ht 6' (1.829 m)   Wt 241 lb 2 oz (109.4 kg)   SpO2 98%   BMI 32.70 kg/m    CC: med refill visit  Subjective:   HPI: Jordan Shelton is a 47 y.o. male presenting on 11/13/2022 for Medication Refill (Requests refill for Robaxin.)   Last seen 09/2021 for blood in stool with fmhx crohn's (mom) - s/p reassuring colonoscopy by Dr Vicente Males 10/2021, followed by hemorrhoidal banding x3 (11/2021, 12/2021, 01/2022).   Requests refill of methocarbamol to use PRN muscle aches - uses sparingly with benefit and tolerates well without sedation or other side effects.  He regularly takes potassium 200-'300mg'$  and magnesium '400mg'$  daily OTC as well as Prilosec OTC PRN for heartburn.   Dyslipidemia - notes HLD runs in family. Possible CAD/CVA in extended family, not premature.   Notes intermittent cough, managed well with tessalon perls   No regular exercise.      Relevant past medical, surgical, family and social history reviewed and updated as indicated. Interim medical history since our last visit reviewed. Allergies and medications reviewed and updated. Outpatient Medications Prior to Visit  Medication Sig Dispense Refill   Coenzyme Q10 (CO Q 10 PO) Take 1 capsule by mouth.     Magnesium 400 MG CAPS Take 1 capsule by mouth daily.     MEGARED OMEGA-3 KRILL OIL 500 MG CAPS Take 500 mg by mouth daily.     Multiple Vitamin (MULTIVITAMIN) tablet Take 1 tablet by mouth daily.     omeprazole (PRILOSEC) 20 MG capsule Take 1 capsule (20 mg total) by mouth daily as needed (reflux).     Potassium Gluconate 550 MG TABS Take 500 mg by mouth.     methocarbamol (ROBAXIN) 500 MG tablet Take 1 tablet (500 mg total) by mouth 3 (three) times daily as needed for muscle spasms (sedation precautions). 30 tablet 1   glycopyrrolate (ROBINUL) 1 MG tablet Take 2  tablets (2 mg total) by mouth 2 (two) times daily. As needed for hyperhidrosis     No facility-administered medications prior to visit.     Per HPI unless specifically indicated in ROS section below Review of Systems  Objective:  BP 128/78   Pulse 86   Temp 97.8 F (36.6 C) (Temporal)   Ht 6' (1.829 m)   Wt 241 lb 2 oz (109.4 kg)   SpO2 98%   BMI 32.70 kg/m   Wt Readings from Last 3 Encounters:  11/13/22 241 lb 2 oz (109.4 kg)  01/28/22 242 lb 9.6 oz (110 kg)  01/03/22 245 lb (111.1 kg)      Physical Exam Vitals and nursing note reviewed.  Constitutional:      Appearance: Normal appearance. He is not ill-appearing.  HENT:     Head: Normocephalic and atraumatic.     Mouth/Throat:     Mouth: Mucous membranes are moist.     Pharynx: Oropharynx is clear. No oropharyngeal exudate or posterior oropharyngeal erythema.  Eyes:     Extraocular Movements: Extraocular movements intact.     Pupils: Pupils are equal, round, and reactive to light.  Neck:     Thyroid: No thyroid mass or thyromegaly.  Cardiovascular:     Rate and Rhythm: Normal rate and regular rhythm.  Pulses: Normal pulses.     Heart sounds: Normal heart sounds. No murmur heard. Pulmonary:     Effort: Pulmonary effort is normal. No respiratory distress.     Breath sounds: Normal breath sounds. No wheezing, rhonchi or rales.  Musculoskeletal:     Cervical back: Normal range of motion and neck supple.     Right lower leg: No edema.     Left lower leg: No edema.  Skin:    General: Skin is warm and dry.     Findings: No rash.  Neurological:     Mental Status: He is alert.  Psychiatric:        Mood and Affect: Mood normal.        Behavior: Behavior normal.        Assessment & Plan:  Rx tessalon perls PRN cough per pt request.   Problem List Items Addressed This Visit     Obesity, Class I, BMI 30-34.9    Encouraged healthy lifestyle choices including regular walking routine to affect sustainable  weight loss.       GERD (gastroesophageal reflux disease)    Continues PRN OTC PPI      Muscle ache - Primary    Notes intermittent muscle aches, finds robaxin helps with this. Requests refill which is reasonable.         Meds ordered this encounter  Medications   methocarbamol (ROBAXIN) 500 MG tablet    Sig: Take 1 tablet (500 mg total) by mouth 3 (three) times daily as needed for muscle spasms (sedation precautions).    Dispense:  30 tablet    Refill:  2   benzonatate (TESSALON) 100 MG capsule    Sig: Take 1 capsule (100 mg total) by mouth 3 (three) times daily as needed for cough.    Dispense:  30 capsule    Refill:  1    No orders of the defined types were placed in this encounter.   Patient Instructions  Return over summer for physical, prior for fasting labs.  Work on walking routine.  Robaxin refilled as well as tessalon perls to use as needed. Caution muscle relaxant can make you sleepy.   Follow up plan: Return in about 3 months (around 02/13/2023) for annual exam, prior fasting for blood work.  Jordan Bush, MD

## 2022-11-13 NOTE — Assessment & Plan Note (Signed)
Encouraged healthy lifestyle choices including regular walking routine to affect sustainable weight loss.

## 2022-11-13 NOTE — Assessment & Plan Note (Signed)
Overdue for CPE and labs - will return for this. Encouraged regular walking routine in interim.

## 2022-11-13 NOTE — Assessment & Plan Note (Addendum)
Continues PRN OTC PPI

## 2023-03-22 ENCOUNTER — Other Ambulatory Visit: Payer: Self-pay | Admitting: Family Medicine

## 2023-03-22 DIAGNOSIS — Z1159 Encounter for screening for other viral diseases: Secondary | ICD-10-CM

## 2023-03-22 DIAGNOSIS — E7800 Pure hypercholesterolemia, unspecified: Secondary | ICD-10-CM

## 2023-03-22 DIAGNOSIS — R7303 Prediabetes: Secondary | ICD-10-CM

## 2023-03-26 ENCOUNTER — Other Ambulatory Visit (INDEPENDENT_AMBULATORY_CARE_PROVIDER_SITE_OTHER): Payer: BC Managed Care – PPO

## 2023-03-26 DIAGNOSIS — E7800 Pure hypercholesterolemia, unspecified: Secondary | ICD-10-CM

## 2023-03-26 DIAGNOSIS — R7303 Prediabetes: Secondary | ICD-10-CM | POA: Diagnosis not present

## 2023-03-26 DIAGNOSIS — Z1159 Encounter for screening for other viral diseases: Secondary | ICD-10-CM | POA: Diagnosis not present

## 2023-03-26 LAB — COMPREHENSIVE METABOLIC PANEL
ALT: 34 U/L (ref 0–53)
AST: 24 U/L (ref 0–37)
Albumin: 4.4 g/dL (ref 3.5–5.2)
Alkaline Phosphatase: 62 U/L (ref 39–117)
BUN: 14 mg/dL (ref 6–23)
CO2: 27 mEq/L (ref 19–32)
Calcium: 9.7 mg/dL (ref 8.4–10.5)
Chloride: 102 mEq/L (ref 96–112)
Creatinine, Ser: 1.03 mg/dL (ref 0.40–1.50)
GFR: 87.89 mL/min (ref >60.00)
Glucose, Bld: 102 mg/dL — ABNORMAL HIGH (ref 70–99)
Potassium: 4.1 mEq/L (ref 3.5–5.1)
Sodium: 137 mEq/L (ref 135–145)
Total Bilirubin: 0.5 mg/dL (ref 0.2–1.2)
Total Protein: 7.2 g/dL (ref 6.0–8.3)

## 2023-03-26 LAB — LIPID PANEL
Cholesterol: 259 mg/dL — ABNORMAL HIGH (ref 0–200)
HDL: 49.6 mg/dL (ref >39.00)
LDL Cholesterol: 170 mg/dL — ABNORMAL HIGH (ref 0–99)
NonHDL: 208.96
Total CHOL/HDL Ratio: 5
Triglycerides: 197 mg/dL — ABNORMAL HIGH (ref 0.0–149.0)
VLDL: 39.4 mg/dL (ref 0.0–40.0)

## 2023-03-26 LAB — HEMOGLOBIN A1C: Hgb A1c MFr Bld: 6.1 % (ref 4.6–6.5)

## 2023-03-27 LAB — TEST AUTHORIZATION

## 2023-03-27 LAB — HEPATITIS C ANTIBODY: Hepatitis C Ab: NONREACTIVE

## 2023-03-30 LAB — TEST AUTHORIZATION

## 2023-03-30 LAB — LIPOPROTEIN A (LPA): Lipoprotein (a): 177 nmol/L — ABNORMAL HIGH (ref <75)

## 2023-04-02 ENCOUNTER — Ambulatory Visit (INDEPENDENT_AMBULATORY_CARE_PROVIDER_SITE_OTHER): Payer: BC Managed Care – PPO | Admitting: Family Medicine

## 2023-04-02 ENCOUNTER — Encounter: Payer: Self-pay | Admitting: Family Medicine

## 2023-04-02 VITALS — BP 132/88 | HR 79 | Temp 97.4°F | Ht 71.0 in | Wt 237.5 lb

## 2023-04-02 DIAGNOSIS — K219 Gastro-esophageal reflux disease without esophagitis: Secondary | ICD-10-CM | POA: Diagnosis not present

## 2023-04-02 DIAGNOSIS — E7841 Elevated Lipoprotein(a): Secondary | ICD-10-CM

## 2023-04-02 DIAGNOSIS — E669 Obesity, unspecified: Secondary | ICD-10-CM | POA: Diagnosis not present

## 2023-04-02 DIAGNOSIS — Z0000 Encounter for general adult medical examination without abnormal findings: Secondary | ICD-10-CM | POA: Diagnosis not present

## 2023-04-02 DIAGNOSIS — M791 Myalgia, unspecified site: Secondary | ICD-10-CM

## 2023-04-02 DIAGNOSIS — R7303 Prediabetes: Secondary | ICD-10-CM

## 2023-04-02 DIAGNOSIS — K760 Fatty (change of) liver, not elsewhere classified: Secondary | ICD-10-CM

## 2023-04-02 MED ORDER — FAMOTIDINE 20 MG PO TABS: 20.0000 mg | ORAL_TABLET | Freq: Every day | ORAL | Status: DC | PRN

## 2023-04-02 MED ORDER — ATORVASTATIN CALCIUM 20 MG PO TABS
20.0000 mg | ORAL_TABLET | Freq: Every day | ORAL | 3 refills | Status: DC
Start: 2023-04-02 — End: 2024-04-05

## 2023-04-02 NOTE — Assessment & Plan Note (Signed)
Preventative protocols reviewed and updated unless pt declined. Discussed healthy diet and lifestyle.  

## 2023-04-02 NOTE — Assessment & Plan Note (Addendum)
Continue to encourage healthy diet and lifestyle choices to affect sustainable weight loss.  °

## 2023-04-02 NOTE — Progress Notes (Signed)
Ph: [phone] Fax: [phone]   Patient ID: Jordan Shelton, male    DOB: June 14, 1976, 47 y.o.   MRN: [id number]  This visit was conducted in person.  BP 132/88 (BP Location: Right Arm, Cuff Size: Large)   Pulse 79   Temp (!) 97.4 F (36.3 C) (Temporal)   Ht 5\' 11"  (1.803 m)   Wt 237 lb 8 oz (107.7 kg)   SpO2 99%   BMI 33.12 kg/m   BP Readings from Last 3 Encounters:  04/02/23 132/88  11/13/22 128/78  01/28/22 134/86   CC: CPE Subjective:   HPI: Jordan Shelton is a 47 y.o. male presenting on 04/02/2023 for Annual Exam   BP elevated today - not checking regularly. Did have coffee this morning.  HLD - elevated Lp(a) to 177.  CAC = 21.5 (02/2020) - low score.   Preventative: COLONOSCOPY WITH PROPOFOL 11/06/2021 - rectal HP, diverticulosis, int hem rpt 10 yrs Tobi Bastos, Sharlet Salina, MD)  Prostate cancer screening - fmhx prostate cancer - father dx age 54  Lung cancer screening - not due Flu shot - yearly COVID shot - x2  Tdap 11/2013 Pneumonia shot - not due Shingrix - not due Advanced directive discussion -  Seat belt use discussed  Sunscreen use discussed. No changing moles on skin. 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 Interim medical history since our last visit reviewed. Allergies and medications reviewed and updated. Outpatient Medications Prior to Visit  Medication Sig Dispense Refill   benzonatate (TESSALON) 100 MG capsule Take 1 capsule (100 mg total) by mouth 3 (three) times daily as needed for cough. 30 capsule 1   Coenzyme Q10 (CO Q 10 PO) Take 1 capsule by mouth.     Magnesium 400 MG CAPS Take 1 capsule by  mouth daily.     MEGARED OMEGA-3 KRILL OIL 500 MG CAPS Take 500 mg by mouth daily.     methocarbamol (ROBAXIN) 500 MG tablet Take 1 tablet (500 mg total) by mouth 3 (three) times daily as needed for muscle spasms (sedation precautions). 30 tablet 2   Multiple Vitamin (MULTIVITAMIN) tablet Take 1 tablet by mouth daily.     omeprazole (PRILOSEC) 20 MG capsule Take 1 capsule (20 mg total) by mouth daily as needed (reflux).     Potassium Gluconate 550 MG TABS Take 500 mg by mouth.     No facility-administered medications prior to visit.     Per HPI unless specifically indicated in ROS section below Review of Systems  Constitutional:  Negative for activity change, appetite change, chills, fatigue, fever and unexpected weight change.  HENT:  Negative for hearing loss.   Eyes:  Negative for visual disturbance.  Respiratory:  Positive for cough (intermittent). Negative for chest tightness, shortness of breath and wheezing.   Cardiovascular:  Positive for chest pain (intermittent dull ache - ?indigestion). Negative for palpitations and leg swelling.  Gastrointestinal:  Negative for abdominal distention, abdominal pain, blood in stool, constipation, diarrhea, nausea and vomiting.  Genitourinary:  Negative for difficulty urinating and hematuria.  Musculoskeletal:  Negative for arthralgias, myalgias and neck pain.  Skin:  Negative for rash.  Neurological:  Positive for headaches (?vision scrain). Negative for dizziness, seizures and syncope.  Hematological:  Negative for adenopathy. Does not bruise/bleed easily.  Psychiatric/Behavioral:  Negative for dysphoric mood. The patient is not nervous/anxious.     Objective:  BP 132/88 (BP Location: Right Arm, Cuff Size: Large)   Pulse 79   Temp (!) 97.4 F (36.3 C) (Temporal)   Ht 5\' 11"  (1.803 m)   Wt 237 lb 8 oz (107.7 kg)   SpO2 99%   BMI 33.12 kg/m   Wt Readings from Last 3 Encounters:  04/02/23 237 lb 8 oz (107.7 kg)  11/13/22 241 lb 2 oz  (109.4 kg)  01/28/22 242 lb 9.6 oz (110 kg)      Physical Exam Vitals and nursing note reviewed.  Constitutional:      General: He is not in acute distress.    Appearance: Normal appearance. He is well-developed. He is not ill-appearing.  HENT:     Head: Normocephalic and atraumatic.     Right Ear: Hearing, tympanic membrane, ear canal and external ear normal.     Left Ear: Hearing, tympanic membrane, ear canal and external ear normal.     Nose: Nose normal.     Mouth/Throat:     Mouth: Mucous membranes are moist.     Pharynx: Oropharynx is clear. No oropharyngeal exudate or posterior oropharyngeal erythema.  Eyes:     General: No scleral icterus.    Extraocular Movements: Extraocular movements intact.     Conjunctiva/sclera: Conjunctivae normal.     Pupils: Pupils are equal, round, and reactive to light.  Neck:     Thyroid: No thyroid mass or thyromegaly.  Cardiovascular:     Rate and Rhythm: Normal rate and regular rhythm.     Pulses: Normal pulses.          Radial pulses are 2+ on the right side and 2+ on the left side.     Heart sounds: Normal heart sounds. No murmur heard. Pulmonary:     Effort: Pulmonary effort is normal. No respiratory distress.     Breath sounds: Normal breath sounds. No wheezing, rhonchi or rales.  Abdominal:     General: Bowel sounds are normal. There is no distension.     Palpations: Abdomen is soft. There is no mass.     Tenderness: There is no abdominal tenderness. There is no guarding or rebound.     Hernia: No hernia is present.  Musculoskeletal:        General: Normal range of motion.     Cervical back: Normal range of motion and neck supple.     Right lower leg: No edema.     Left lower leg: No edema.  Lymphadenopathy:     Cervical: No cervical adenopathy.  Skin:    General: Skin is warm and dry.     Findings: No rash.  Neurological:     General: No focal deficit present.     Mental Status: He is alert and oriented to person, place,  and time.  Psychiatric:        Mood and Affect: Mood normal.        Behavior: Behavior normal.        Thought Content: Thought content normal.        Judgment: Judgment normal.       Results for orders placed or performed in visit on 03/26/23  Hepatitis C antibody  Result Value Ref Range   Hepatitis  C Ab NON-REACTIVE NON-REACTIVE  Hemoglobin A1c  Result Value Ref Range   Hgb A1c MFr Bld 6.1 4.6 - 6.5 %  Comprehensive metabolic panel  Result Value Ref Range   Sodium 137 135 - 145 mEq/L   Potassium 4.1 3.5 - 5.1 mEq/L   Chloride 102 96 - 112 mEq/L   CO2 27 19 - 32 mEq/L   Glucose, Bld 102 (H) 70 - 99 mg/dL   BUN 14 6 - 23 mg/dL   Creatinine, Ser 6.64 0.40 - 1.50 mg/dL   Total Bilirubin 0.5 0.2 - 1.2 mg/dL   Alkaline Phosphatase 62 39 - 117 U/L   AST 24 0 - 37 U/L   ALT 34 0 - 53 U/L   Total Protein 7.2 6.0 - 8.3 g/dL   Albumin 4.4 3.5 - 5.2 g/dL   GFR 40.34 >74.25 mL/min   Calcium 9.7 8.4 - 10.5 mg/dL  Lipid panel  Result Value Ref Range   Cholesterol 259 (H) 0 - 200 mg/dL   Triglycerides 956.3 (H) 0.0 - 149.0 mg/dL   HDL 87.56 >43.32 mg/dL   VLDL 95.1 0.0 - 88.4 mg/dL   LDL Cholesterol 166 (H) 0 - 99 mg/dL   Total CHOL/HDL Ratio 5    NonHDL 208.96   Lipoprotein A (LPA)  Result Value Ref Range   Lipoprotein (a) 177 (H) <75 nmol/L  TEST AUTHORIZATION  Result Value Ref Range   TEST NAME: LIPOPROTEIN (a)    TEST CODE: [id number]    CLIENT CONTACT: KERRI WALSH    REPORT ALWAYS MESSAGE SIGNATURE     No results found for: "CKTOTAL"  Lab Results  Component Value Date   TSH 1.74 01/28/2020    Assessment & Plan:   Problem List Items Addressed This Visit     Health care maintenance - Primary (Chronic)    Preventative protocols reviewed and updated unless pt declined. Discussed healthy diet and lifestyle.       Obesity, Class I, BMI 30-34.9    Continue to encourage healthy diet and lifestyle choices to affect sustainable weight loss.       GERD  (gastroesophageal reflux disease)    Stable period on OTC PPI PRN. Discussed trying famotidine (pepcid) in place of omeprazole to see if cramping improves. Ok to continue potassium and magnesium supplements      Relevant Medications   famotidine (PEPCID) 20 MG tablet   HLD (hyperlipidemia)    Lipids elevated, Lp(a) level elevated. Will start atorvastatin 20mg  daily - sent to pharmacy.  The 10-year ASCVD risk score (Arnett DK, et al., 2019) is: 3.3%   Values used to calculate the score:     Age: 61 years     Sex: Male     Is Non-Hispanic African American: No     Diabetic: No     Tobacco smoker: No     Systolic Blood Pressure: 132 mmHg     Is BP treated: No     HDL Cholesterol: 49.6 mg/dL     Total Cholesterol: 259 mg/dL       Relevant Medications   atorvastatin (LIPITOR) 20 MG tablet   Other Relevant Orders   Lipid panel   Comprehensive metabolic panel   TSH   Fatty liver disease, nonalcoholic    Noted on prior imaging. LFTs remain normal.      Prediabetes    Encouraged limiting added sugar in diet.       Muscle ache    Muscle aches/cramps.  Discussed PPI  use - change to pepcid if able.  Check CPK and Mg next labwork.  Monitor this with statin commencement. Continue CoQ10.       Relevant Orders   CK   Magnesium     Meds ordered this encounter  Medications   atorvastatin (LIPITOR) 20 MG tablet    Sig: Take 1 tablet (20 mg total) by mouth daily.    Dispense:  90 tablet    Refill:  3   famotidine (PEPCID) 20 MG tablet    Sig: Take 1 tablet (20 mg total) by mouth daily as needed for heartburn or indigestion.    Orders Placed This Encounter  Procedures   Lipid panel    Standing Status:   Future    Standing Expiration Date:   04/01/2024   Comprehensive metabolic panel    Standing Status:   Future    Standing Expiration Date:   04/01/2024   CK    Standing Status:   Future    Standing Expiration Date:   04/01/2024   Magnesium    Standing Status:   Future     Standing Expiration Date:   04/01/2024   TSH    Standing Status:   Future    Standing Expiration Date:   04/01/2024    Patient Instructions  Cholesterol levels were elevated including lipoprotein a - start atorvastatin 20mg  daily sent to pharmacy. Let me know if any trouble tolerating this.  Schedule fasting labs in 3 months to repeat cholesterol levels.  Try pepcid 20mg  in place of omeprazole 20mg  as needed for heartburn/reflux.  Good to see you today Return in 1 year for next physical.   Follow up plan: Return in about 1 year (around 04/01/2024) for annual exam, prior fasting for blood work.  Eustaquio Boyden, MD

## 2023-04-02 NOTE — Assessment & Plan Note (Addendum)
Noted on prior imaging. LFTs remain normal.

## 2023-04-02 NOTE — Assessment & Plan Note (Signed)
Encouraged limiting added sugar in diet.  

## 2023-04-02 NOTE — Patient Instructions (Addendum)
Cholesterol levels were elevated including lipoprotein a - start atorvastatin 20mg  daily sent to pharmacy. Let me know if any trouble tolerating this.  Schedule fasting labs in 3 months to repeat cholesterol levels.  Try pepcid 20mg  in place of omeprazole 20mg  as needed for heartburn/reflux.  Good to see you today Return in 1 year for next physical.

## 2023-04-02 NOTE — Assessment & Plan Note (Signed)
Lipids elevated, Lp(a) level elevated. Will start atorvastatin 20mg  daily - sent to pharmacy.  The 10-year ASCVD risk score (Arnett DK, et al., 2019) is: 3.3%   Values used to calculate the score:     Age: 47 years     Sex: Male     Is Non-Hispanic African American: No     Diabetic: No     Tobacco smoker: No     Systolic Blood Pressure: 132 mmHg     Is BP treated: No     HDL Cholesterol: 49.6 mg/dL     Total Cholesterol: 259 mg/dL

## 2023-04-02 NOTE — Assessment & Plan Note (Addendum)
Stable period on OTC PPI PRN. Discussed trying famotidine (pepcid) in place of omeprazole to see if cramping improves. Ok to continue potassium and magnesium supplements

## 2023-04-02 NOTE — Assessment & Plan Note (Signed)
Muscle aches/cramps.  Discussed PPI use - change to pepcid if able.  Check CPK and Mg next labwork.  Monitor this with statin commencement. Continue CoQ10.

## 2023-04-22 ENCOUNTER — Encounter (INDEPENDENT_AMBULATORY_CARE_PROVIDER_SITE_OTHER): Payer: BC Managed Care – PPO | Admitting: Family Medicine

## 2023-04-22 DIAGNOSIS — R030 Elevated blood-pressure reading, without diagnosis of hypertension: Secondary | ICD-10-CM

## 2023-04-22 MED ORDER — AMLODIPINE BESYLATE 5 MG PO TABS: 5.0000 mg | ORAL_TABLET | Freq: Every day | ORAL | 11 refills | Status: DC

## 2023-04-22 NOTE — Telephone Encounter (Signed)

## 2023-06-10 HISTORY — PX: LASIK: SHX215

## 2023-06-26 ENCOUNTER — Other Ambulatory Visit (INDEPENDENT_AMBULATORY_CARE_PROVIDER_SITE_OTHER): Payer: BC Managed Care – PPO

## 2023-06-26 DIAGNOSIS — M791 Myalgia, unspecified site: Secondary | ICD-10-CM | POA: Diagnosis not present

## 2023-06-26 DIAGNOSIS — E7841 Elevated Lipoprotein(a): Secondary | ICD-10-CM

## 2023-06-26 LAB — LIPID PANEL
Cholesterol: 196 mg/dL (ref 0–200)
HDL: 47.5 mg/dL (ref >39.00)
LDL Cholesterol: 112 mg/dL — ABNORMAL HIGH (ref 0–99)
NonHDL: 148.35
Total CHOL/HDL Ratio: 4
Triglycerides: 184 mg/dL — ABNORMAL HIGH (ref 0.0–149.0)
VLDL: 36.8 mg/dL (ref 0.0–40.0)

## 2023-06-26 LAB — COMPREHENSIVE METABOLIC PANEL
ALT: 31 U/L (ref 0–53)
AST: 18 U/L (ref 0–37)
Albumin: 4.4 g/dL (ref 3.5–5.2)
Alkaline Phosphatase: 73 U/L (ref 39–117)
BUN: 17 mg/dL (ref 6–23)
CO2: 29 meq/L (ref 19–32)
Calcium: 9.6 mg/dL (ref 8.4–10.5)
Chloride: 102 meq/L (ref 96–112)
Creatinine, Ser: 1.1 mg/dL (ref 0.40–1.50)
GFR: 81.08 mL/min (ref >60.00)
Glucose, Bld: 106 mg/dL — ABNORMAL HIGH (ref 70–99)
Potassium: 3.9 meq/L (ref 3.5–5.1)
Sodium: 140 meq/L (ref 135–145)
Total Bilirubin: 0.5 mg/dL (ref 0.2–1.2)
Total Protein: 7.1 g/dL (ref 6.0–8.3)

## 2023-06-26 LAB — CK: Total CK: 185 U/L (ref 7–232)

## 2023-06-26 LAB — TSH: TSH: 1.63 u[IU]/mL (ref 0.35–5.50)

## 2023-06-26 LAB — MAGNESIUM: Magnesium: 2 mg/dL (ref 1.5–2.5)

## 2023-07-01 ENCOUNTER — Encounter: Payer: Self-pay | Admitting: Family Medicine

## 2023-07-01 NOTE — Telephone Encounter (Signed)
Spoke with pt scheduling OV on 07/22/23 at 9:30 to discuss wt loss med options.

## 2023-07-22 ENCOUNTER — Ambulatory Visit: Payer: BC Managed Care – PPO | Admitting: Family Medicine

## 2023-07-23 ENCOUNTER — Encounter: Payer: Self-pay | Admitting: Family Medicine

## 2023-07-23 ENCOUNTER — Other Ambulatory Visit: Payer: Self-pay | Admitting: Family Medicine

## 2023-07-23 ENCOUNTER — Telehealth: Payer: Self-pay | Admitting: Family Medicine

## 2023-07-23 ENCOUNTER — Ambulatory Visit: Payer: BC Managed Care – PPO | Admitting: Family Medicine

## 2023-07-23 VITALS — BP 134/84 | HR 72 | Temp 97.2°F | Ht 71.0 in | Wt 241.0 lb

## 2023-07-23 DIAGNOSIS — I10 Essential (primary) hypertension: Secondary | ICD-10-CM

## 2023-07-23 DIAGNOSIS — E66811 Obesity, class 1: Secondary | ICD-10-CM

## 2023-07-23 DIAGNOSIS — I2510 Atherosclerotic heart disease of native coronary artery without angina pectoris: Secondary | ICD-10-CM | POA: Diagnosis not present

## 2023-07-23 DIAGNOSIS — E7841 Elevated Lipoprotein(a): Secondary | ICD-10-CM

## 2023-07-23 DIAGNOSIS — Z9189 Other specified personal risk factors, not elsewhere classified: Secondary | ICD-10-CM | POA: Diagnosis not present

## 2023-07-23 DIAGNOSIS — R931 Abnormal findings on diagnostic imaging of heart and coronary circulation: Secondary | ICD-10-CM | POA: Insufficient documentation

## 2023-07-23 DIAGNOSIS — Z23 Encounter for immunization: Secondary | ICD-10-CM

## 2023-07-23 MED ORDER — BENZONATATE 100 MG PO CAPS: 100.0000 mg | ORAL_CAPSULE | Freq: Three times a day (TID) | ORAL | 1 refills | Status: DC | PRN

## 2023-07-23 MED ORDER — WEGOVY 0.5 MG/0.5ML ~~LOC~~ SOAJ: 0.5000 mg | SUBCUTANEOUS | 1 refills | Status: DC

## 2023-07-23 MED ORDER — WEGOVY 0.25 MG/0.5ML ~~LOC~~ SOAJ: 0.2500 mg | SUBCUTANEOUS | 0 refills | Status: DC

## 2023-07-23 NOTE — Patient Instructions (Addendum)
Double check to ensure no family history of medullary thyroid cancer.  I have sent Wegovy 0.25mg  weekly for the first month , then increase to 0.5mg  weekly. We will work on prior authorization for this.  Let me know if unaffordable.  Return in 6 weeks after starting wegovy for follow up visit.

## 2023-07-23 NOTE — Assessment & Plan Note (Signed)
Doing well on amlodipine 5mg  daily - continue.

## 2023-07-23 NOTE — Assessment & Plan Note (Addendum)
Patient is interested in [facility]. Reviewed mechanism of action of medication as well as side effects and adverse events to watch for including nausea, diarrhea, constipation, pancreatitis. No fmhx medullary thyroid cancer or MEN2. Discussed titration schedule for medication. Will start wegovy 0.25mg  weekly x 1 mo then increase to 0.5mg  weekly. Discussed need for regular visits for weight management to monitor medication effect and tolerance and weight loss, rec return 4-6 wks after starting medication.   Will ask PA team to work on PA - pt has obesity BMI 33, HTN, Dyslipidemia with elevated Lp(a) levels, and mild CAD based on coronary calcium score 2021.

## 2023-07-23 NOTE — Assessment & Plan Note (Signed)
Doing well on atorvastatin 20mg  daily. H/o elevated Lp(a) to 177  The 10-year ASCVD risk score (Arnett DK, et al., 2019) is: 2.3%   Values used to calculate the score:     Age: 47 years     Sex: Male     Is Non-Hispanic African American: No     Diabetic: No     Tobacco smoker: No     Systolic Blood Pressure: 134 mmHg     Is BP treated: No     HDL Cholesterol: 47.5 mg/dL     Total Cholesterol: 196 mg/dL

## 2023-07-23 NOTE — Addendum Note (Signed)
Addended by: Eustaquio Boyden on: 07/23/2023 12:44 PM   Modules accepted: Level of Service

## 2023-07-23 NOTE — Telephone Encounter (Signed)
I have prescribed wegovy for patient - can we work on PA for this?  Pt has obesity BMI 33, HTN, Dyslipidemia with elevated Lp(a) levels, and mild CAD based on coronary calcium score (2021) - is at increased cardiovascular risk.  Thank you.

## 2023-07-23 NOTE — Progress Notes (Signed)
Ph: [phone] Fax: [phone]   Patient ID: Jordan Shelton, male    DOB: 07-Feb-1976, 47 y.o.   MRN: [id number]  This visit was conducted in person.  BP 134/84 (BP Location: Left Arm, Patient Position: Sitting, Cuff Size: Large)   Pulse 72   Temp (!) 97.2 F (36.2 C) (Tympanic)   Ht 5\' 11"  (1.803 m)   Wt 241 lb (109.3 kg)   SpO2 98%   BMI 33.61 kg/m    CC: discuss weight loss meds  Subjective:   HPI: Jordan Shelton is a 47 y.o. male presenting on 07/23/2023 for Medical Management of Chronic Issues (Discuss weight loss medication options.)   Starting weight: 241 lbs Today's weight 241 lbs  Interested in weight loss medication to help with weight loss.  He's followed low calorie (1000cal/day) bone broth diet - 1 cup of bone broth 3-4 times a day with regular dinner at night.   Years ago did [employer] diet - always just temporary success Currently using [employer] for dinners 4d/wk  Always feels hungry, never satiated.   24 hour recall: 8am breakfast - 1 fried egg with 1 slice of white bread toast, coffee with 2 tablespoons of DD sweet cream  2:30pm lunch - 3 thin slices of summer sausage, 1 slice pepperoni pizza, coke zero  6pm dinner - 6-8 pieces of breaded purdue chicken chunks, 5-6 raw baby carrots, coke zero  No snacks  Limited fluid intake   Activity regimen: Daily resistance training - 15 min arm curls No regular aerobic exercise    CAC = 21.5 (02/2020).  HLD - elevated Lp(a) to 177 (03/2023).  Atorvastatin started 03/2023 Last visit we also started amlodipine 5mg  daily and he's tolerating well.  No personal h/o diabetes. He does have fmhx CAD - grandfathers on both sides with CAD/MI.      Relevant past medical, surgical, family and social history reviewed and updated as indicated. Interim medical history since our last visit reviewed. Allergies and medications reviewed and updated. Outpatient Medications Prior to Visit  Medication Sig Dispense Refill    amLODipine (NORVASC) 5 MG tablet Take 1 tablet (5 mg total) by mouth daily. 30 tablet 11   atorvastatin (LIPITOR) 20 MG tablet Take 1 tablet (20 mg total) by mouth daily. 90 tablet 3   Coenzyme Q10 (CO Q 10 PO) Take 1 capsule by mouth.     famotidine (PEPCID) 20 MG tablet Take 1 tablet (20 mg total) by mouth daily as needed for heartburn or indigestion.     Magnesium 400 MG CAPS Take 1 capsule by mouth daily.     MEGARED OMEGA-3 KRILL OIL 500 MG CAPS Take 500 mg by mouth daily.     methocarbamol (ROBAXIN) 500 MG tablet Take 1 tablet (500 mg total) by mouth 3 (three) times daily as needed for muscle spasms (sedation precautions). 30 tablet 2   Multiple Vitamin (MULTIVITAMIN) tablet Take 1 tablet by mouth daily.     omeprazole (PRILOSEC) 20 MG capsule Take 1 capsule (20 mg total) by mouth daily as needed (reflux).     Potassium Gluconate 550 MG TABS Take 500 mg by mouth.     benzonatate (TESSALON) 100 MG capsule Take 1 capsule (100 mg total) by mouth 3 (three) times daily as needed for cough. 30 capsule 1   No facility-administered medications prior to visit.     Per HPI unless specifically indicated in ROS section below Review of Systems  Objective:  BP 134/84 (BP Location: Left  Arm, Patient Position: Sitting, Cuff Size: Large)   Pulse 72   Temp (!) 97.2 F (36.2 C) (Tympanic)   Ht 5\' 11"  (1.803 m)   Wt 241 lb (109.3 kg)   SpO2 98%   BMI 33.61 kg/m   Wt Readings from Last 3 Encounters:  07/23/23 241 lb (109.3 kg)  04/02/23 237 lb 8 oz (107.7 kg)  11/13/22 241 lb 2 oz (109.4 kg)      Physical Exam Vitals and nursing note reviewed.  Constitutional:      Appearance: Normal appearance. He is not ill-appearing.  HENT:     Mouth/Throat:     Mouth: Mucous membranes are moist.     Pharynx: Oropharynx is clear. No oropharyngeal exudate or posterior oropharyngeal erythema.  Eyes:     Extraocular Movements: Extraocular movements intact.     Pupils: Pupils are equal, round, and  reactive to light.  Neck:     Thyroid: No thyroid mass or thyromegaly.  Cardiovascular:     Rate and Rhythm: Normal rate and regular rhythm.     Pulses: Normal pulses.     Heart sounds: Normal heart sounds. No murmur heard. Pulmonary:     Effort: Pulmonary effort is normal. No respiratory distress.     Breath sounds: Normal breath sounds. No wheezing, rhonchi or rales.  Musculoskeletal:     Cervical back: Normal range of motion and neck supple.  Skin:    General: Skin is warm and dry.  Neurological:     Mental Status: He is alert.  Psychiatric:        Mood and Affect: Mood normal.        Behavior: Behavior normal.       Results for orders placed or performed in visit on 06/26/23  TSH  Result Value Ref Range   TSH 1.63 0.35 - 5.50 uIU/mL  Magnesium  Result Value Ref Range   Magnesium 2.0 1.5 - 2.5 mg/dL  CK  Result Value Ref Range   Total CK 185 7 - 232 U/L  Comprehensive metabolic panel  Result Value Ref Range   Sodium 140 135 - 145 mEq/L   Potassium 3.9 3.5 - 5.1 mEq/L   Chloride 102 96 - 112 mEq/L   CO2 29 19 - 32 mEq/L   Glucose, Bld 106 (H) 70 - 99 mg/dL   BUN 17 6 - 23 mg/dL   Creatinine, Ser 1.61 0.40 - 1.50 mg/dL   Total Bilirubin 0.5 0.2 - 1.2 mg/dL   Alkaline Phosphatase 73 39 - 117 U/L   AST 18 0 - 37 U/L   ALT 31 0 - 53 U/L   Total Protein 7.1 6.0 - 8.3 g/dL   Albumin 4.4 3.5 - 5.2 g/dL   GFR 09.60 >45.40 mL/min   Calcium 9.6 8.4 - 10.5 mg/dL  Lipid panel  Result Value Ref Range   Cholesterol 196 0 - 200 mg/dL   Triglycerides 981.1 (H) 0.0 - 149.0 mg/dL   HDL 91.47 >82.95 mg/dL   VLDL 62.1 0.0 - 30.8 mg/dL   LDL Cholesterol 657 (H) 0 - 99 mg/dL   Total CHOL/HDL Ratio 4    NonHDL 148.35    No results found for: "PSA1", "PSA"  Assessment & Plan:   Problem List Items Addressed This Visit     Obesity, Class I, BMI 30-34.9 - Primary    Patient is interested in [facility]. Reviewed mechanism of action of medication as well as side effects and  adverse events to  watch for including nausea, diarrhea, constipation, pancreatitis. No fmhx medullary thyroid cancer or MEN2. Discussed titration schedule for medication. Will start wegovy 0.25mg  weekly x 1 mo then increase to 0.5mg  weekly. Discussed need for regular visits for weight management to monitor medication effect and tolerance and weight loss, rec return 4-6 wks after starting medication.   Will ask PA team to work on PA - pt has obesity BMI 33, HTN, Dyslipidemia with elevated Lp(a) levels, and mild CAD based on coronary calcium score 2021.       HLD (hyperlipidemia)    Doing well on atorvastatin 20mg  daily. H/o elevated Lp(a) to 177  The 10-year ASCVD risk score (Arnett DK, et al., 2019) is: 2.3%   Values used to calculate the score:     Age: 59 years     Sex: Male     Is Non-Hispanic African American: No     Diabetic: No     Tobacco smoker: No     Systolic Blood Pressure: 134 mmHg     Is BP treated: No     HDL Cholesterol: 47.5 mg/dL     Total Cholesterol: 196 mg/dL       Hypertension    Doing well on amlodipine 5mg  daily - continue.       Coronary artery calcification seen on CAT scan   Other Visit Diagnoses     Immunization due       Relevant Orders   Flu vaccine trivalent PF, 6mos and older(Flulaval,Afluria,Fluarix,Fluzone) (Completed)   At increased risk for cardiovascular disease            Meds ordered this encounter  Medications   Semaglutide-Weight Management (WEGOVY) 0.25 MG/0.5ML SOAJ    Sig: Inject 0.25 mg into the skin once a week.    Dispense:  2 mL    Refill:  0   Semaglutide-Weight Management (WEGOVY) 0.5 MG/0.5ML SOAJ    Sig: Inject 0.5 mg into the skin once a week.    Dispense:  2 mL    Refill:  1   benzonatate (TESSALON) 100 MG capsule    Sig: Take 1 capsule (100 mg total) by mouth 3 (three) times daily as needed for cough.    Dispense:  30 capsule    Refill:  1    Orders Placed This Encounter  Procedures   Flu vaccine trivalent PF,  6mos and older(Flulaval,Afluria,Fluarix,Fluzone)    Patient Instructions  Double check to ensure no family history of medullary thyroid cancer.  I have sent [employer] 0.25mg  weekly for the first month , then increase to 0.5mg  weekly. We will work on prior authorization for this.  Let me know if unaffordable.  Return in 6 weeks after starting wegovy for follow up visit.   Follow up plan: Return in about 6 weeks (around 09/03/2023) for follow up visit.  Eustaquio Boyden, MD

## 2023-07-25 ENCOUNTER — Other Ambulatory Visit (HOSPITAL_COMMUNITY): Payer: Self-pay

## 2023-07-25 ENCOUNTER — Telehealth: Payer: Self-pay

## 2023-07-25 NOTE — Telephone Encounter (Signed)
Pharmacy Patient Advocate Encounter   Received notification from Pt Calls Messages that prior authorization for Wegovy 0.25MG /0.5ML auto-injectors is required/requested.   Insurance verification completed.   The patient is insured through [facility]  .   Per test claim: CANCELLED due to:  Prior Authorization Not Available - This product is excluded from this benefit plan. Anti-obesity medication not covered.

## 2023-07-25 NOTE — Telephone Encounter (Signed)
PA request has been Cancelled. New Encounter created for follow up. For additional info see Pharmacy Prior Auth telephone encounter from 07/25/23.

## 2023-07-28 ENCOUNTER — Other Ambulatory Visit: Payer: Self-pay | Admitting: Family Medicine

## 2023-07-28 MED ORDER — TIRZEPATIDE-WEIGHT MANAGEMENT 2.5 MG/0.5ML ~~LOC~~ SOLN: 2.5000 mg | SUBCUTANEOUS | 0 refills | Status: DC

## 2023-07-28 MED ORDER — TIRZEPATIDE-WEIGHT MANAGEMENT 5 MG/0.5ML ~~LOC~~ SOLN: 5.0000 mg | SUBCUTANEOUS | 0 refills | Status: DC

## 2023-07-28 NOTE — Telephone Encounter (Signed)
See refill request.

## 2023-07-28 NOTE — Telephone Encounter (Signed)
Spoke with pt relaying Dr Timoteo Expose message. Pt verbalizes understanding and will check on out-of-pocket cost of Wegovy and Zepbound if [facility] not affordable. Pt will let us know what he finds out.

## 2023-07-28 NOTE — Telephone Encounter (Signed)
Lvm asking pt to call back. Need to relay Dr. G's message and get answer to his question.  

## 2023-07-28 NOTE — Telephone Encounter (Signed)
Plz notify pt - wegovy was denied due to Anti-obesity medication not covered by his insurance.   Has he been able to price out self-pay?   If unaffordable, he may price out zepbound which I have sent to:  [facility] PAY FOR [facility] [place], [place] - [address]  [phone] He should call them to verify cost.

## 2023-07-28 NOTE — Telephone Encounter (Signed)
Patient returned call regarding wegovy.Would like a return call

## 2024-03-28 ENCOUNTER — Other Ambulatory Visit: Payer: Self-pay | Admitting: Family Medicine

## 2024-03-28 DIAGNOSIS — K760 Fatty (change of) liver, not elsewhere classified: Secondary | ICD-10-CM

## 2024-03-28 DIAGNOSIS — I10 Essential (primary) hypertension: Secondary | ICD-10-CM

## 2024-03-28 DIAGNOSIS — E7841 Elevated Lipoprotein(a): Secondary | ICD-10-CM

## 2024-03-28 DIAGNOSIS — R7303 Prediabetes: Secondary | ICD-10-CM

## 2024-03-29 ENCOUNTER — Other Ambulatory Visit (INDEPENDENT_AMBULATORY_CARE_PROVIDER_SITE_OTHER): Payer: BC Managed Care – PPO

## 2024-03-29 ENCOUNTER — Ambulatory Visit: Payer: Self-pay | Admitting: Family Medicine

## 2024-03-29 DIAGNOSIS — I10 Essential (primary) hypertension: Secondary | ICD-10-CM | POA: Diagnosis not present

## 2024-03-29 DIAGNOSIS — E7841 Elevated Lipoprotein(a): Secondary | ICD-10-CM | POA: Diagnosis not present

## 2024-03-29 DIAGNOSIS — K760 Fatty (change of) liver, not elsewhere classified: Secondary | ICD-10-CM

## 2024-03-29 DIAGNOSIS — R7303 Prediabetes: Secondary | ICD-10-CM

## 2024-03-29 LAB — LIPID PANEL
Cholesterol: 162 mg/dL (ref 0–200)
HDL: 47.4 mg/dL (ref >39.00)
LDL Cholesterol: 88 mg/dL (ref 0–99)
NonHDL: 114.16
Total CHOL/HDL Ratio: 3
Triglycerides: 133 mg/dL (ref 0.0–149.0)
VLDL: 26.6 mg/dL (ref 0.0–40.0)

## 2024-03-29 LAB — CBC WITH DIFFERENTIAL/PLATELET
Basophils Absolute: 0 K/uL (ref 0.0–0.1)
Basophils Relative: 0.7 % (ref 0.0–3.0)
Eosinophils Absolute: 0.2 K/uL (ref 0.0–0.7)
Eosinophils Relative: 3.5 % (ref 0.0–5.0)
HCT: 43.3 % (ref 39.0–52.0)
Hemoglobin: 14.2 g/dL (ref 13.0–17.0)
Lymphocytes Relative: 21.6 % (ref 12.0–46.0)
Lymphs Abs: 1.2 K/uL (ref 0.7–4.0)
MCHC: 32.9 g/dL (ref 30.0–36.0)
MCV: 86.9 fl (ref 78.0–100.0)
Monocytes Absolute: 0.6 K/uL (ref 0.1–1.0)
Monocytes Relative: 11.2 % (ref 3.0–12.0)
Neutro Abs: 3.4 K/uL (ref 1.4–7.7)
Neutrophils Relative %: 63 % (ref 43.0–77.0)
Platelets: 210 K/uL (ref 150.0–400.0)
RBC: 4.98 Mil/uL (ref 4.22–5.81)
RDW: 14.1 % (ref 11.5–15.5)
WBC: 5.3 K/uL (ref 4.0–10.5)

## 2024-03-29 LAB — COMPREHENSIVE METABOLIC PANEL WITH GFR
ALT: 36 U/L (ref 0–53)
AST: 23 U/L (ref 0–37)
Albumin: 4.4 g/dL (ref 3.5–5.2)
Alkaline Phosphatase: 68 U/L (ref 39–117)
BUN: 13 mg/dL (ref 6–23)
CO2: 26 meq/L (ref 19–32)
Calcium: 9.2 mg/dL (ref 8.4–10.5)
Chloride: 103 meq/L (ref 96–112)
Creatinine, Ser: 1.01 mg/dL (ref 0.40–1.50)
GFR: 89.35 mL/min (ref >60.00)
Glucose, Bld: 104 mg/dL — ABNORMAL HIGH (ref 70–99)
Potassium: 4.1 meq/L (ref 3.5–5.1)
Sodium: 138 meq/L (ref 135–145)
Total Bilirubin: 0.6 mg/dL (ref 0.2–1.2)
Total Protein: 6.7 g/dL (ref 6.0–8.3)

## 2024-03-29 LAB — MICROALBUMIN / CREATININE URINE RATIO
Creatinine,U: 280.8 mg/dL
Microalb Creat Ratio: 4.4 mg/g (ref 0.0–30.0)
Microalb, Ur: 1.2 mg/dL (ref 0.0–1.9)

## 2024-03-29 LAB — HEMOGLOBIN A1C: Hgb A1c MFr Bld: 5.9 % (ref 4.6–6.5)

## 2024-04-04 ENCOUNTER — Other Ambulatory Visit: Payer: Self-pay | Admitting: Family Medicine

## 2024-04-05 ENCOUNTER — Encounter: Payer: Self-pay | Admitting: Family Medicine

## 2024-04-05 ENCOUNTER — Ambulatory Visit (INDEPENDENT_AMBULATORY_CARE_PROVIDER_SITE_OTHER): Payer: BC Managed Care – PPO | Admitting: Family Medicine

## 2024-04-05 VITALS — BP 130/82 | HR 82 | Temp 98.9°F | Ht 71.0 in | Wt 218.5 lb

## 2024-04-05 DIAGNOSIS — R809 Proteinuria, unspecified: Secondary | ICD-10-CM | POA: Diagnosis not present

## 2024-04-05 DIAGNOSIS — E7841 Elevated Lipoprotein(a): Secondary | ICD-10-CM

## 2024-04-05 DIAGNOSIS — E66811 Obesity, class 1: Secondary | ICD-10-CM | POA: Diagnosis not present

## 2024-04-05 DIAGNOSIS — R931 Abnormal findings on diagnostic imaging of heart and coronary circulation: Secondary | ICD-10-CM

## 2024-04-05 DIAGNOSIS — Z23 Encounter for immunization: Secondary | ICD-10-CM

## 2024-04-05 DIAGNOSIS — K219 Gastro-esophageal reflux disease without esophagitis: Secondary | ICD-10-CM

## 2024-04-05 DIAGNOSIS — K760 Fatty (change of) liver, not elsewhere classified: Secondary | ICD-10-CM

## 2024-04-05 DIAGNOSIS — Z0000 Encounter for general adult medical examination without abnormal findings: Secondary | ICD-10-CM

## 2024-04-05 DIAGNOSIS — R7303 Prediabetes: Secondary | ICD-10-CM

## 2024-04-05 DIAGNOSIS — I10 Essential (primary) hypertension: Secondary | ICD-10-CM

## 2024-04-05 MED ORDER — ATORVASTATIN CALCIUM 20 MG PO TABS: 20.0000 mg | ORAL_TABLET | Freq: Every day | ORAL | 3 refills | Status: AC

## 2024-04-05 MED ORDER — FLUTICASONE PROPIONATE 0.05 % EX CREA: TOPICAL_CREAM | Freq: Two times a day (BID) | CUTANEOUS | 0 refills | Status: AC | PRN

## 2024-04-05 MED ORDER — FLUOCINONIDE 0.05 % EX SOLN: 1.0000 | Freq: Two times a day (BID) | CUTANEOUS | 0 refills | Status: AC

## 2024-04-05 MED ORDER — BENZONATATE 100 MG PO CAPS: 100.0000 mg | ORAL_CAPSULE | Freq: Three times a day (TID) | ORAL | 1 refills | Status: AC | PRN

## 2024-04-05 MED ORDER — KETOCONAZOLE 2 % EX SHAM: 1.0000 | MEDICATED_SHAMPOO | CUTANEOUS | 0 refills | Status: DC

## 2024-04-05 MED ORDER — METHOCARBAMOL 500 MG PO TABS: 500.0000 mg | ORAL_TABLET | Freq: Three times a day (TID) | ORAL | 2 refills | Status: AC | PRN

## 2024-04-05 MED ORDER — AMLODIPINE BESYLATE 5 MG PO TABS: 5.0000 mg | ORAL_TABLET | Freq: Every day | ORAL | 3 refills | Status: AC

## 2024-04-05 NOTE — Patient Instructions (Signed)
 Remind us  to add PSA to next labwork.  Congrats on weight loss! Cholesterol levels are improving.  Continue healthy diet and lifestyle  Return as needed or in 1 year for next physical

## 2024-04-05 NOTE — Progress Notes (Addendum)
 Ph: [phone] Fax: [phone]   Patient ID: Jordan Shelton, male    DOB: [birth year], 48 y.o.   MRN: [id number]  This visit was conducted in person.  BP 130/82   Pulse 82   Temp 98.9 F (37.2 C) (Oral)   Ht 5' 11 (1.803 m)   Wt 218 lb 8 oz (99.1 kg)   SpO2 99%   BMI 30.47 kg/m    CC: CPE Subjective:   HPI: Jordan Shelton is a 48 y.o. male presenting on 04/05/2024 for Annual Exam   HLD - elevated Lp(a) to 177.  Cardiac CT with CAC score of 21.5 (02/2020) - %ile not calculated given age.   Takes potassium and magnesium  for leg cramping.  Scalp psoriasis - requests refills of ketoconazole  shampoo and fluocinonide  soln and fluticasone  cream.   Has been fishing a lot - 20+ lb weight loss in the past year.   Preventative: COLONOSCOPY WITH PROPOFOL  11/06/2021 - rectal HP, diverticulosis, int hem rpt 10 yrs Romero, Ruel, MD)  Prostate cancer screening - fmhx prostate cancer - father dx age 68  Lung cancer screening - not due Flu shot - yearly COVID shot - x2  Tdap 11/2013, update today  Pneumonia shot - not due Shingrix - not due Advanced directive discussion -  Seat belt use discussed  Sunscreen use discussed. No changing moles on skin. Saw [facility].  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 Interim medical history since our last visit reviewed. Allergies and medications reviewed and updated. Outpatient Medications Prior to Visit  Medication Sig Dispense Refill   Coenzyme Q10 (CO Q 10 PO) Take 1 capsule by mouth.     Magnesium  400 MG CAPS Take 1 capsule by mouth daily.     MEGARED OMEGA-3 KRILL OIL 500 MG CAPS Take 500 mg by  mouth daily.     Multiple Vitamin (MULTIVITAMIN) tablet Take 1 tablet by mouth daily.     omeprazole  (PRILOSEC) 20 MG capsule Take 1 capsule (20 mg total) by mouth daily as needed (reflux).     Potassium Gluconate 550 MG TABS Take 500 mg by mouth.     amLODipine  (NORVASC ) 5 MG tablet Take 1 tablet (5 mg total) by mouth daily. 30 tablet 11   atorvastatin  (LIPITOR) 20 MG tablet Take 1 tablet (20 mg total) by mouth daily. 90 tablet 3   benzonatate  (TESSALON ) 100 MG capsule Take 1 capsule (100 mg total) by mouth 3 (three) times daily as needed for cough. 30 capsule 1   methocarbamol  (ROBAXIN ) 500 MG tablet Take 1 tablet (500 mg total) by mouth 3 (three) times daily as needed for muscle spasms (sedation precautions). 30 tablet 2   famotidine  (PEPCID ) 20 MG tablet Take 1 tablet (20 mg total) by mouth daily as needed for heartburn or indigestion.     Semaglutide -Weight Management (WEGOVY ) 0.25 MG/0.5ML SOAJ Inject 0.25 mg into the skin once a week. 2 mL 0   Semaglutide -Weight Management (WEGOVY ) 0.5 MG/0.5ML SOAJ Inject 0.5 mg into the skin once a week. 2 mL 1   tirzepatide  (ZEPBOUND ) 2.5 MG/0.5ML injection vial Inject 2.5 mg into  the skin once a week. 2 mL 0   tirzepatide  5 MG/0.5ML injection vial Inject 5 mg into the skin once a week. 2 mL 0   No facility-administered medications prior to visit.     Per HPI unless specifically indicated in ROS section below Review of Systems  Constitutional:  Negative for activity change, appetite change, chills, fatigue, fever and unexpected weight change.  HENT:  Negative for hearing loss.   Eyes:  Negative for visual disturbance.  Respiratory:  Positive for cough (occ) and chest tightness (intermittent s/p reassuring cards eval 2021). Negative for shortness of breath and wheezing.   Cardiovascular:  Negative for chest pain, palpitations and leg swelling.  Gastrointestinal:  Negative for abdominal distention, abdominal pain, blood in stool, constipation,  diarrhea, nausea and vomiting.  Genitourinary:  Negative for difficulty urinating and hematuria.  Musculoskeletal:  Negative for arthralgias, myalgias and neck pain.  Skin:  Negative for rash.  Neurological:  Negative for dizziness, seizures, syncope and headaches.  Hematological:  Negative for adenopathy. Does not bruise/bleed easily.  Psychiatric/Behavioral:  Negative for dysphoric mood. The patient is not nervous/anxious.     Objective:  BP 130/82   Pulse 82   Temp 98.9 F (37.2 C) (Oral)   Ht 5' 11 (1.803 m)   Wt 218 lb 8 oz (99.1 kg)   SpO2 99%   BMI 30.47 kg/m   Wt Readings from Last 3 Encounters:  04/05/24 218 lb 8 oz (99.1 kg)  07/23/23 241 lb (109.3 kg)  04/02/23 237 lb 8 oz (107.7 kg)      Physical Exam Vitals and nursing note reviewed.  Constitutional:      General: He is not in acute distress.    Appearance: Normal appearance. He is well-developed. He is not ill-appearing.  HENT:     Head: Normocephalic and atraumatic.     Right Ear: Hearing, tympanic membrane, ear canal and external ear normal.     Left Ear: Hearing, tympanic membrane, ear canal and external ear normal.     Mouth/Throat:     Mouth: Mucous membranes are moist.     Pharynx: Oropharynx is clear. No oropharyngeal exudate or posterior oropharyngeal erythema.  Eyes:     General: No scleral icterus.    Extraocular Movements: Extraocular movements intact.     Conjunctiva/sclera: Conjunctivae normal.     Pupils: Pupils are equal, round, and reactive to light.  Neck:     Thyroid : No thyroid  mass or thyromegaly.  Cardiovascular:     Rate and Rhythm: Normal rate and regular rhythm.     Pulses: Normal pulses.          Radial pulses are 2+ on the right side and 2+ on the left side.     Heart sounds: Normal heart sounds. No murmur heard. Pulmonary:     Effort: Pulmonary effort is normal. No respiratory distress.     Breath sounds: Normal breath sounds. No wheezing, rhonchi or rales.  Abdominal:      General: Bowel sounds are normal. There is no distension.     Palpations: Abdomen is soft. There is no mass.     Tenderness: There is no abdominal tenderness. There is no guarding or rebound.     Hernia: No hernia is present.  Musculoskeletal:        General: Normal range of motion.     Cervical back: Normal range of motion and neck supple.     Right lower leg: No edema.  Left lower leg: No edema.  Lymphadenopathy:     Cervical: No cervical adenopathy.  Skin:    General: Skin is warm and dry.     Findings: No rash.  Neurological:     General: No focal deficit present.     Mental Status: He is alert and oriented to person, place, and time.  Psychiatric:        Mood and Affect: Mood normal.        Behavior: Behavior normal.        Thought Content: Thought content normal.        Judgment: Judgment normal.       Results for orders placed or performed in visit on 03/29/24  CBC with Differential/Platelet   Collection Time: 03/29/24  7:32 AM  Result Value Ref Range   WBC 5.3 4.0 - 10.5 K/uL   RBC 4.98 4.22 - 5.81 Mil/uL   Hemoglobin 14.2 13.0 - 17.0 g/dL   HCT 56.6 60.9 - 47.9 %   MCV 86.9 78.0 - 100.0 fl   MCHC 32.9 30.0 - 36.0 g/dL   RDW 85.8 88.4 - 84.4 %   Platelets 210.0 150.0 - 400.0 K/uL   Neutrophils Relative % 63.0 43.0 - 77.0 %   Lymphocytes Relative 21.6 12.0 - 46.0 %   Monocytes Relative 11.2 3.0 - 12.0 %   Eosinophils Relative 3.5 0.0 - 5.0 %   Basophils Relative 0.7 0.0 - 3.0 %   Neutro Abs 3.4 1.4 - 7.7 K/uL   Lymphs Abs 1.2 0.7 - 4.0 K/uL   Monocytes Absolute 0.6 0.1 - 1.0 K/uL   Eosinophils Absolute 0.2 0.0 - 0.7 K/uL   Basophils Absolute 0.0 0.0 - 0.1 K/uL  Microalbumin / creatinine urine ratio   Collection Time: 03/29/24  7:32 AM  Result Value Ref Range   Microalb, Ur 1.2 0.0 - 1.9 mg/dL   Creatinine,U 719.1 mg/dL   Microalb Creat Ratio 4.4 0.0 - 30.0 mg/g  Hemoglobin A1c   Collection Time: 03/29/24  7:32 AM  Result Value Ref Range   Hgb A1c MFr  Bld 5.9 4.6 - 6.5 %  Lipid panel   Collection Time: 03/29/24  7:32 AM  Result Value Ref Range   Cholesterol 162 0 - 200 mg/dL   Triglycerides 866.9 0.0 - 149.0 mg/dL   HDL 52.59 >60.99 mg/dL   VLDL 73.3 0.0 - 59.9 mg/dL   LDL Cholesterol 88 0 - 99 mg/dL   Total CHOL/HDL Ratio 3    NonHDL 114.16   Comprehensive metabolic panel with GFR   Collection Time: 03/29/24  7:32 AM  Result Value Ref Range   Sodium 138 135 - 145 mEq/L   Potassium 4.1 3.5 - 5.1 mEq/L   Chloride 103 96 - 112 mEq/L   CO2 26 19 - 32 mEq/L   Glucose, Bld 104 (H) 70 - 99 mg/dL   BUN 13 6 - 23 mg/dL   Creatinine, Ser 8.98 0.40 - 1.50 mg/dL   Total Bilirubin 0.6 0.2 - 1.2 mg/dL   Alkaline Phosphatase 68 39 - 117 U/L   AST 23 0 - 37 U/L   ALT 36 0 - 53 U/L   Total Protein 6.7 6.0 - 8.3 g/dL   Albumin 4.4 3.5 - 5.2 g/dL   GFR 10.64 >39.99 mL/min   Calcium  9.2 8.4 - 10.5 mg/dL    Assessment & Plan:   Problem List Items Addressed This Visit     Health care maintenance - Primary (Chronic)  Preventative protocols reviewed and updated unless pt declined. Discussed healthy diet and lifestyle.       Obesity, Class I, BMI 30-34.9   Congratulated on weight loss to date. Encouraged ongoing healthy diet and lifestyle choices to affect sustainable weight loss.       GERD (gastroesophageal reflux disease)   Stable on omeprazole  20mg  daily      HLD (hyperlipidemia)   Chronic, stable on atorvastatin . High Lp(a) levels.  The 10-year ASCVD risk score (Arnett DK, et al., 2019) is: 2.1%   Values used to calculate the score:     Age: 14 years     Clincally relevant sex: Male     Is Non-Hispanic African American: No     Diabetic: No     Tobacco smoker: No     Systolic Blood Pressure: 130 mmHg     Is BP treated: Yes     HDL Cholesterol: 47.4 mg/dL     Total Cholesterol: 162 mg/dL       Relevant Medications   amLODipine  (NORVASC ) 5 MG tablet   atorvastatin  (LIPITOR) 20 MG tablet   Metabolic  dysfunction-associated fatty liver disease (MAFLD)   Noted on prior imaging. Continue weight loss.   Fibrosis 4 Score = .84 (Low risk)       Interpretation for patients with HCV          <1.45       -  F0-F1 (Low risk)          1.45-3.25 -  Indeterminate           >3.25      -  F3-F4 (High risk)     Validated for ages 109-65      Prediabetes   Improving A1c on latest check.       Proteinuria   Umicroalb normal on latest check       Hypertension   Chronic, stable on amlodipine .       Relevant Medications   amLODipine  (NORVASC ) 5 MG tablet   atorvastatin  (LIPITOR) 20 MG tablet   Agatston coronary artery calcium  score less than 100   Other Visit Diagnoses       Need for Tdap vaccination       Relevant Orders   Tdap vaccine greater than or equal to 7yo IM (Completed)        Meds ordered this encounter  Medications   amLODipine  (NORVASC ) 5 MG tablet    Sig: Take 1 tablet (5 mg total) by mouth daily.    Dispense:  90 tablet    Refill:  3   atorvastatin  (LIPITOR) 20 MG tablet    Sig: Take 1 tablet (20 mg total) by mouth daily.    Dispense:  90 tablet    Refill:  3   benzonatate  (TESSALON ) 100 MG capsule    Sig: Take 1 capsule (100 mg total) by mouth 3 (three) times daily as needed for cough.    Dispense:  30 capsule    Refill:  1   methocarbamol  (ROBAXIN ) 500 MG tablet    Sig: Take 1 tablet (500 mg total) by mouth 3 (three) times daily as needed for muscle spasms (sedation precautions).    Dispense:  30 tablet    Refill:  2   fluticasone  (CUTIVATE ) 0.05 % cream    Sig: Apply topically 2 (two) times daily as needed (dermatitis).    Dispense:  30 g    Refill:  0   fluocinonide  (LIDEX ) 0.05 % external  solution    Sig: Apply 1 Application topically 2 (two) times daily.    Dispense:  60 mL    Refill:  0   ketoconazole  (NIZORAL ) 2 % shampoo    Sig: Apply 1 Application topically 2 (two) times a week.    Dispense:  120 mL    Refill:  0    Orders Placed This  Encounter  Procedures   Tdap vaccine greater than or equal to 7yo IM    Patient Instructions  Remind us  to add PSA to next labwork.  Congrats on weight loss! Cholesterol levels are improving.  Continue healthy diet and lifestyle  Return as needed or in 1 year for next physical   Follow up plan: Return in about 1 year (around 04/05/2025) for annual exam, prior fasting for blood work.  Anton Blas, MD

## 2024-04-05 NOTE — Addendum Note (Signed)
 Addended by: SEBASTIAN DANNA GRADE on: 04/05/2024 10:56 AM   Modules accepted: Orders

## 2024-04-05 NOTE — Assessment & Plan Note (Signed)
Stable on omeprazole 20mg daily.  

## 2024-04-05 NOTE — Assessment & Plan Note (Signed)
 Chronic, stable on atorvastatin . High Lp(a) levels.  The 10-year ASCVD risk score (Arnett DK, et al., 2019) is: 2.1%   Values used to calculate the score:     Age: 48 years     Clincally relevant sex: Male     Is Non-Hispanic African American: No     Diabetic: No     Tobacco smoker: No     Systolic Blood Pressure: 130 mmHg     Is BP treated: Yes     HDL Cholesterol: 47.4 mg/dL     Total Cholesterol: 162 mg/dL

## 2024-04-05 NOTE — Assessment & Plan Note (Signed)
Chronic, stable on amlodipine.

## 2024-04-05 NOTE — Assessment & Plan Note (Signed)
Congratulated on weight loss to date. Encouraged ongoing healthy diet and lifestyle choices to affect sustainable weight loss.

## 2024-04-05 NOTE — Assessment & Plan Note (Signed)
 Preventative protocols reviewed and updated unless pt declined. Discussed healthy diet and lifestyle.

## 2024-04-05 NOTE — Assessment & Plan Note (Signed)
 Umicroalb normal on latest check

## 2024-04-05 NOTE — Assessment & Plan Note (Signed)
 Noted on prior imaging. Continue weight loss.   Fibrosis 4 Score = .84 (Low risk)       Interpretation for patients with HCV          <1.45       -  F0-F1 (Low risk)          1.45-3.25 -  Indeterminate           >3.25      -  F3-F4 (High risk)     Validated for ages 20-65

## 2024-04-05 NOTE — Assessment & Plan Note (Signed)
 Improving A1c on latest check.

## 2024-05-03 ENCOUNTER — Other Ambulatory Visit: Payer: Self-pay | Admitting: Family Medicine

## 2024-05-04 ENCOUNTER — Ambulatory Visit (INDEPENDENT_AMBULATORY_CARE_PROVIDER_SITE_OTHER): Admitting: Dermatology

## 2024-05-04 ENCOUNTER — Encounter: Payer: Self-pay | Admitting: Dermatology

## 2024-05-04 DIAGNOSIS — D485 Neoplasm of uncertain behavior of skin: Secondary | ICD-10-CM

## 2024-05-04 DIAGNOSIS — D239 Other benign neoplasm of skin, unspecified: Secondary | ICD-10-CM

## 2024-05-04 DIAGNOSIS — Z1283 Encounter for screening for malignant neoplasm of skin: Secondary | ICD-10-CM

## 2024-05-04 DIAGNOSIS — D225 Melanocytic nevi of trunk: Secondary | ICD-10-CM | POA: Diagnosis not present

## 2024-05-04 DIAGNOSIS — L219 Seborrheic dermatitis, unspecified: Secondary | ICD-10-CM

## 2024-05-04 DIAGNOSIS — L821 Other seborrheic keratosis: Secondary | ICD-10-CM

## 2024-05-04 DIAGNOSIS — D229 Melanocytic nevi, unspecified: Secondary | ICD-10-CM

## 2024-05-04 DIAGNOSIS — L814 Other melanin hyperpigmentation: Secondary | ICD-10-CM

## 2024-05-04 DIAGNOSIS — L578 Other skin changes due to chronic exposure to nonionizing radiation: Secondary | ICD-10-CM

## 2024-05-04 DIAGNOSIS — D1801 Hemangioma of skin and subcutaneous tissue: Secondary | ICD-10-CM

## 2024-05-04 DIAGNOSIS — W908XXA Exposure to other nonionizing radiation, initial encounter: Secondary | ICD-10-CM

## 2024-05-04 HISTORY — DX: Other benign neoplasm of skin, unspecified: D23.9

## 2024-05-04 MED ORDER — CICLOPIROX 1 % EX SHAM: MEDICATED_SHAMPOO | CUTANEOUS | 6 refills | Status: AC

## 2024-05-04 MED ORDER — ZORYVE 0.3 % EX FOAM: 1.0000 | Freq: Every day | CUTANEOUS | 11 refills | Status: AC

## 2024-05-04 NOTE — Progress Notes (Signed)
 New Patient Visit   Subjective  Jordan Shelton is a 48 y.o. male who presents for the following: Skin Cancer Screening and Full Body Skin Exam  The patient presents for Total-Body Skin Exam (TBSE) for skin cancer screening and mole check. The patient has spots, moles and lesions to be evaluated, some may be new or changing.   Was seen by Dr. Melonie office in the past. Hx psoriasis, was prescribed ketoconazole  cream and shampoo, ciclopirox  cream, fluticasone  and fluocinonide  solution for the scalp. Pt has noticed a small patch on the R forehead, and a hx of patch on the chest, and beard area. Scalp is always flaky even though he is using ketoconazole  2% shampoo.   No hx of skin cancer or dysplastic nevi, no know family hx of melanoma. Pt states that he has had moles removed in the past, but none of them were cancerous.  The following portions of the chart were reviewed this encounter and updated as appropriate: medications, allergies, medical history  Review of Systems:  No other skin or systemic complaints except as noted in HPI or Assessment and Plan.  Objective  Well appearing patient in no apparent distress; mood and affect are within normal limits.  A full examination was performed including scalp, head, eyes, ears, nose, lips, neck, chest, axillae, abdomen, back, buttocks, bilateral upper extremities, bilateral lower extremities, hands, feet, fingers, toes, fingernails, and toenails. All findings within normal limits unless otherwise noted below.   Relevant physical exam findings are noted in the Assessment and Plan.  L abdomen 1.1 x 1.0 cm pigmented patch.  R abdomen 1.0 x 0.4 cm light brown patch   Assessment & Plan   SKIN CANCER SCREENING PERFORMED TODAY.  ACTINIC DAMAGE - Chronic condition, secondary to cumulative UV/sun exposure - diffuse scaly erythematous macules with underlying dyspigmentation - Recommend daily broad spectrum sunscreen SPF 30+ to sun-exposed areas,  reapply every 2 hours as needed.  - Staying in the shade or wearing long sleeves, sun glasses (UVA+UVB protection) and wide brim hats (4-inch brim around the entire circumference of the hat) are also recommended for sun protection.  - Call for new or changing lesions.  LENTIGINES, SEBORRHEIC KERATOSES, HEMANGIOMAS - Benign normal skin lesions - Benign-appearing - Call for any changes  MELANOCYTIC NEVI - Several abnormal appearing, but patient has been watching them and hasn't noticed any changes, he's also had bx in the past and states that they came back benign. - Tan-brown and/or pink-flesh-colored symmetric macules and papules - Benign appearing on exam today - Observation - Call clinic for new or changing moles - Recommend daily use of broad spectrum spf 30+ sunscreen to sun-exposed areas.   SEBORRHEIC DERMATITIS Exam: Pink patches with greasy scale at the eyebrows, beard, and scalp  Chronic and persistent condition with duration or expected duration over one year. Condition is symptomatic/ bothersome to patient. Not currently at goal.  Seborrheic Dermatitis is a chronic persistent rash characterized by pinkness and scaling most commonly of the mid face but also can occur on the scalp (dandruff), ears; mid chest, mid back and groin.  It tends to be exacerbated by stress and cooler weather.  People who have neurologic disease may experience new onset or exacerbation of existing seborrheic dermatitis.  The condition is not curable but treatable and can be controlled.  Treatment Plan: Continue ciclopirox  shampoo massage into face and scalp let sit 3-5 minutes then wash off. Use three times weekly. Start Zoryve  foam to aa's face/scalp/ears QD.  NEOPLASM OF UNCERTAIN BEHAVIOR OF SKIN (2) L abdomen Epidermal / dermal shaving  Lesion diameter (cm):  1.1 Informed consent: discussed and consent obtained   Timeout: patient name, date of birth, surgical site, and procedure verified    Procedure prep:  Patient was prepped and draped in usual sterile fashion Prep type:  Isopropyl alcohol Anesthesia: the lesion was anesthetized in a standard fashion   Anesthetic:  1% lidocaine  w/ epinephrine 1-100,000 buffered w/ 8.4% NaHCO3 Instrument used: DermaBlade   Hemostasis achieved with: pressure and aluminum chloride   Outcome: patient tolerated procedure well   Post-procedure details: wound care instructions given    Specimen 1 - Surgical pathology Differential Diagnosis: D48.5 congenital nevus vs dysplastic nevus vs MM Check Margins: Yes R abdomen Epidermal / dermal shaving  Lesion diameter (cm):  1 Informed consent: discussed and consent obtained   Timeout: patient name, date of birth, surgical site, and procedure verified   Procedure prep:  Patient was prepped and draped in usual sterile fashion Prep type:  Isopropyl alcohol Anesthesia: the lesion was anesthetized in a standard fashion   Anesthetic:  1% lidocaine  w/ epinephrine 1-100,000 buffered w/ 8.4% NaHCO3 Instrument used: DermaBlade   Hemostasis achieved with: pressure and aluminum chloride   Outcome: patient tolerated procedure well   Post-procedure details: wound care instructions given    Specimen 2 - Surgical pathology Differential Diagnosis: D48.5 congenital nevus vs dysplastic nevus vs MM Check Margins: Yes SEBORRHEIC DERMATITIS   MULTIPLE BENIGN NEVI   LENTIGINES   ACTINIC ELASTOSIS   CHERRY ANGIOMA   SEBORRHEIC KERATOSES    Return in about 1 year (around 05/04/2025) for TBSE and seb derm follow up.  LILLETTE Rosina Mayans, CMA, am acting as scribe for Boneta Sharps, MD .  Documentation: I have reviewed the above documentation for accuracy and completeness, and I agree with the above.  Boneta Sharps, MD

## 2024-05-04 NOTE — Patient Instructions (Addendum)
 Your prescription was sent to [facility] in [place]. A representative from [facility] will contact you within 3 business hours to verify your address and insurance information to schedule a free delivery. If for any reason you do not receive a phone call from them, please reach out to them. Their phone number is [phone] and their hours are Monday-Friday 9:00 am-5:00 pm.    Wound Care Instructions  Cleanse wound gently with soap and water once a day then pat dry with clean gauze. Apply a thin coat of Petrolatum (petroleum jelly, Vaseline) over the wound (unless you have an allergy to this). We recommend that you use a new, sterile tube of Vaseline. Do not pick or remove scabs. Do not remove the yellow or white healing tissue from the base of the wound.  Cover the wound with fresh, clean, nonstick gauze and secure with paper tape. You may use Band-Aids in place of gauze and tape if the wound is small enough, but would recommend trimming much of the tape off as there is often too much. Sometimes Band-Aids can irritate the skin.  You should call the office for your biopsy report after 1 week if you have not already been contacted.  If you experience any problems, such as abnormal amounts of bleeding, swelling, significant bruising, significant pain, or evidence of infection, please call the office immediately.  FOR ADULT SURGERY PATIENTS: If you need something for pain relief you may take 1 extra strength Tylenol (acetaminophen) AND 2 Ibuprofen (200mg  each) together every 4 hours as needed for pain. (do not take these if you are allergic to them or if you have a reason you should not take them.) Typically, you may only need pain medication for 1 to 3 days.    Due to recent changes in healthcare laws, you may see results of your pathology and/or laboratory studies on MyChart before the doctors have had a chance to review them. We understand that in some cases there may be results  that are confusing or concerning to you. Please understand that not all results are received at the same time and often the doctors may need to interpret multiple results in order to provide you with the best plan of care or course of treatment. Therefore, we ask that you please give us  2 business days to thoroughly review all your results before contacting the office for clarification. Should we see a critical lab result, you will be contacted sooner.   If You Need Anything After Your Visit  If you have any questions or concerns for your doctor, please call our main line at [phone] and press option 4 to reach your doctor's medical assistant. If no one answers, please leave a voicemail as directed and we will return your call as soon as possible. Messages left after 4 pm will be answered the following business day.   You may also send us  a message via MyChart. We typically respond to MyChart messages within 1-2 business days.  For prescription refills, please ask your pharmacy to contact our office. Our fax number is [phone].  If you have an urgent issue when the clinic is closed that cannot wait until the next business day, you can page your doctor at the number below.    Please note that while we do our best to be available for urgent issues outside of office hours, we are not available 24/7.   If you have an urgent issue and are unable to reach us , you  may choose to seek medical care at your doctor's office, retail clinic, urgent care center, or emergency room.  If you have a medical emergency, please immediately call 911 or go to the emergency department.  Pager Numbers  - Dr. Hester: [phone]  - Dr. Jackquline: [phone]  - Dr. Claudene: [phone]   - Dr. Raymund: [phone]  In the event of inclement weather, please call our main line at [phone] for an update on the status of any delays or closures.  Dermatology Medication Tips: Please keep the boxes that  topical medications come in in order to help keep track of the instructions about where and how to use these. Pharmacies typically print the medication instructions only on the boxes and not directly on the medication tubes.   If your medication is too expensive, please contact our office at [phone] option 4 or send us  a message through MyChart.   We are unable to tell what your co-pay for medications will be in advance as this is different depending on your insurance coverage. However, we may be able to find a substitute medication at lower cost or fill out paperwork to get insurance to cover a needed medication.   If a prior authorization is required to get your medication covered by your insurance company, please allow us  1-2 business days to complete this process.  Drug prices often vary depending on where the prescription is filled and some pharmacies may offer cheaper prices.  The website www.goodrx.com contains coupons for medications through different pharmacies. The prices here do not account for what the cost may be with help from insurance (it may be cheaper with your insurance), but the website can give you the price if you did not use any insurance.  - You can print the associated coupon and take it with your prescription to the pharmacy.  - You may also stop by our office during regular business hours and pick up a GoodRx coupon card.  - If you need your prescription sent electronically to a different pharmacy, notify our office through [facility] or by phone at [phone] option 4.     Si Usted Necesita Algo Despus de Su Visita  Tambin puede enviarnos un mensaje a travs de Clinical cytogeneticist. Por lo general respondemos a los mensajes de MyChart en el transcurso de 1 a 2 das hbiles.  Para renovar recetas, por favor pida a su farmacia que se ponga en contacto con nuestra oficina. Randi lakes de fax es [place] [phone].  Si tiene un asunto urgente cuando la clnica  est cerrada y que no puede esperar hasta el siguiente da hbil, puede llamar/localizar a su doctor(a) al nmero que aparece a continuacin.   Por favor, tenga en cuenta que aunque hacemos todo lo posible para estar disponibles para asuntos urgentes fuera del horario de [place], no estamos disponibles las 24 horas del da, los 7 [address]  [address] de la [place].   Si tiene un problema urgente y no puede comunicarse con nosotros, puede optar por buscar atencin mdica  en el consultorio de su doctor(a), en una clnica privada, en un centro de atencin urgente o en una sala de emergencias.  Si tiene Engineer, drilling, por favor llame inmediatamente al 911 o vaya a la sala de emergencias.  Nmeros de bper  - Dr. Hester: [phone]  - Dra. Jackquline: [phone]  - Dr. Claudene: [phone]  - Dra. Kitts: [phone]  En caso de inclemencias del [place], por favor llame a nuestra lnea principal al [phone]  para [employer] de cualquier retraso o cierre.  Consejos para la medicacin en dermatologa: Por favor, guarde las cajas en las que vienen los medicamentos de uso tpico para ayudarle a seguir las instrucciones sobre dnde y cmo usarlos. Las farmacias generalmente imprimen las instrucciones del medicamento slo en las cajas y no directamente en los tubos del [place].   Si su medicamento es muy caro, por favor, pngase en contacto con landry rieger llamando al [phone] y presione la opcin 4 o envenos un mensaje a travs de Clinical cytogeneticist.   No podemos decirle cul ser su copago por los medicamentos por adelantado ya que esto es diferente dependiendo de la cobertura de su seguro. Sin embargo, es posible que podamos encontrar un medicamento sustituto a Audiological scientist un formulario para que el seguro cubra el medicamento que se considera necesario.   Si se requiere una autorizacin previa para que su compaa de seguros malta su medicamento, por favor permtanos  de 1 a 2 das hbiles para completar este proceso.  Los precios de los medicamentos varan con frecuencia dependiendo del Environmental consultant de dnde se surte la receta y alguna farmacias pueden ofrecer precios ms baratos.  El sitio web www.goodrx.com tiene cupones para medicamentos de Health and safety inspector. Los precios aqu no tienen en cuenta lo que podra costar con la ayuda del seguro (puede ser ms barato con su seguro), pero el sitio web puede darle el precio si no utiliz Tourist information centre manager.  - Puede imprimir el cupn correspondiente y llevarlo con su receta a la farmacia.  - Tambin puede pasar por nuestra oficina durante el horario de atencin regular y Education officer, museum una tarjeta de cupones de GoodRx.  - Si necesita que su receta se enve electrnicamente a una farmacia diferente, informe a nuestra oficina a travs de MyChart de  o por telfono llamando al [phone] y presione la opcin 4.

## 2024-05-06 LAB — SURGICAL PATHOLOGY

## 2024-05-12 ENCOUNTER — Ambulatory Visit: Payer: Self-pay | Admitting: Dermatology

## 2024-09-20 ENCOUNTER — Encounter: Payer: Self-pay | Admitting: Family Medicine

## 2025-05-05 ENCOUNTER — Ambulatory Visit: Admitting: Dermatology
